# Patient Record
Sex: Female | Born: 1940 | Race: White | Hispanic: Yes | Marital: Married | Smoking: Never smoker
Health system: Western US, Academic
[De-identification: ages and names within clinical notes are randomized; demographics above are authoritative.]

## PROBLEM LIST (undated history)

## (undated) DIAGNOSIS — E119 Type 2 diabetes mellitus without complications: Secondary | ICD-10-CM

## (undated) DIAGNOSIS — R002 Palpitations: Secondary | ICD-10-CM

## (undated) DIAGNOSIS — I10 Essential (primary) hypertension: Secondary | ICD-10-CM

## (undated) HISTORY — PX: HYSTERECTOMY: SHX81

## (undated) HISTORY — PX: BLADDER SURGERY: SHX569

---

## 2014-08-27 ENCOUNTER — Emergency Department: Admission: EM | Admit: 2014-08-27 | Payer: Self-pay | Admitting: Emergency Medicine

## 2014-08-27 LAB — COMPREHENSIVE METABOLIC PANEL, BLOOD
ALT: 18 U/L (ref 7–52)
AST: 17 U/L (ref 13–39)
Albumin: 4.2 G/DL (ref 3.7–5.3)
Alk Phos: 135 U/L — ABNORMAL HIGH (ref 34–104)
BUN: 12 mg/dL (ref 7–25)
Bilirubin, Total: 0.8 mg/dL (ref 0.3–1.0)
CO2: 26 mmol/L (ref 21–31)
Calcium: 9 mg/dL (ref 8.6–10.3)
Chloride: 102 mmol/L (ref 98–107)
Creat: 0.5 mg/dL — ABNORMAL LOW (ref 0.6–1.2)
Electrolyte Balance: 8 mmol/L (ref 2–12)
Glucose: 210 mg/dL — ABNORMAL HIGH (ref 85–125)
Potassium: 4.3 mmol/L (ref 3.5–5.1)
Protein, Total: 7.1 G/DL (ref 6.0–8.3)
Sodium: 136 mmol/L (ref 136–145)

## 2014-08-27 LAB — CBC WITH DIFF, BLOOD
Basophil: 0.1 10*3/uL (ref 0–0.2)
Eosinophil: 0.2 10*3/uL (ref 0–0.5)
Hematocrit: 38 % (ref 34.0–44.0)
Hgb: 12.8 G/DL (ref 11.5–15.0)
Lymphocyte: 1.8 10*3/uL (ref 0.9–3.3)
MCH: 30.1 PG (ref 27.0–33.5)
MCHC: 33.8 G/DL (ref 32.0–35.5)
MCV: 89 FL (ref 81.5–97.0)
Monocyte: 0.4 10*3/uL (ref 0–0.8)
Neutrophils: 5.5 10*3/uL (ref 2.0–8.1)
PLT Count: 261 10*3/uL (ref 150–400)
RBC: 4.26 10*6/uL (ref 3.70–5.00)
RDW-CV: 12.8 % (ref 11.6–14.4)
White Bld Cell Count: 7.9 10*3/uL (ref 4.0–10.5)

## 2014-08-27 LAB — LIPASE, BLOOD: Lipase: 27 U/L (ref 11–82)

## 2014-08-27 LAB — CPK+CKMB+INDEX PANEL, BLOOD
CKMB: 2.2 ng/mL (ref 0.0–5.0)
Tot CK: 60 U/L (ref 30–223)

## 2014-08-27 LAB — TROPONIN I, BLOOD: Troponin I: <0.03 ng/mL (ref 0.00–0.03)

## 2014-08-28 LAB — URINALYSIS
Bilirubin, UA: NEGATIVE
Glucose, UA: 150 MG/DL — AB
Hemoglobin, UA: NEGATIVE
Ketones, UA: NEGATIVE MG/DL
Leukocyte Esterase, UA: NEGATIVE
Nitrite, UA: NEGATIVE
Protein, UA: 30 MG/DL — AB
RBC, UA: 2 #/HPF (ref 0–3)
Specific Grav, UA: 1.018 (ref 1.003–1.030)
Squamous Epithelial, UA: 0 /HPF (ref 0–10)
Urobilinogen, UA: <2 MG/DL (ref <2.0)
WBC, UA: <1 #/HPF (ref 0–5)
pH, UA: 5 (ref 5.0–8.0)

## 2014-08-28 LAB — TROPONIN I, BLOOD: Troponin I: <0.03 ng/mL (ref 0.00–0.03)

## 2014-08-28 LAB — GLUCOSE POINT OF CARE TESTING: Glucose POC: 248 MG/DL — ABNORMAL HIGH (ref 85–125)

## 2014-09-01 ENCOUNTER — Emergency Department: Admission: EM | Admit: 2014-09-01 | Payer: Self-pay

## 2016-07-11 ENCOUNTER — Emergency Department
Admission: EM | Admit: 2016-07-11 | Discharge: 2016-07-12 | Disposition: A | Discharge status: Home / Self Care | Payer: No Typology Code available for payment source | Attending: Emergency Medicine | Admitting: Emergency Medicine

## 2016-07-11 DIAGNOSIS — Z794 Long term (current) use of insulin: Secondary | ICD-10-CM | POA: Insufficient documentation

## 2016-07-11 DIAGNOSIS — I10 Essential (primary) hypertension: Secondary | ICD-10-CM | POA: Insufficient documentation

## 2016-07-11 DIAGNOSIS — N390 Urinary tract infection, site not specified: Secondary | ICD-10-CM | POA: Insufficient documentation

## 2016-07-11 DIAGNOSIS — Z90710 Acquired absence of both cervix and uterus: Secondary | ICD-10-CM | POA: Insufficient documentation

## 2016-07-11 DIAGNOSIS — T83511A Infection and inflammatory reaction due to indwelling urethral catheter, initial encounter: Principal | ICD-10-CM | POA: Insufficient documentation

## 2016-07-11 DIAGNOSIS — E119 Type 2 diabetes mellitus without complications: Secondary | ICD-10-CM | POA: Insufficient documentation

## 2016-07-11 DIAGNOSIS — R339 Retention of urine, unspecified: Secondary | ICD-10-CM | POA: Insufficient documentation

## 2016-07-11 DIAGNOSIS — Y848 Other medical procedures as the cause of abnormal reaction of the patient, or of later complication, without mention of misadventure at the time of the procedure: Secondary | ICD-10-CM | POA: Insufficient documentation

## 2016-07-11 DIAGNOSIS — N281 Cyst of kidney, acquired: Secondary | ICD-10-CM | POA: Insufficient documentation

## 2016-07-11 DIAGNOSIS — N1330 Unspecified hydronephrosis: Secondary | ICD-10-CM | POA: Insufficient documentation

## 2016-07-11 HISTORY — DX: Type 2 diabetes mellitus without complications (CMS-HCC): E11.9

## 2016-07-11 HISTORY — DX: Palpitations: R00.2

## 2016-07-11 HISTORY — DX: Essential (primary) hypertension: I10

## 2016-07-11 LAB — COMPREHENSIVE METABOLIC PANEL, BLOOD
ALT: 8 U/L (ref 7–52)
AST: 12 U/L — ABNORMAL LOW (ref 13–39)
Albumin: 4 G/DL (ref 3.7–5.3)
Alk Phos: 93 U/L (ref 34–104)
BUN: 22 mg/dL (ref 7–25)
Bilirubin, Total: 0.6 mg/dL (ref 0.0–1.4)
CO2: 19 mmol/L — ABNORMAL LOW (ref 21–31)
Calcium: 9.4 mg/dL (ref 8.6–10.3)
Chloride: 104 mmol/L (ref 98–107)
Creat: 1 mg/dL (ref 0.6–1.2)
Electrolyte Balance: 9 mmol/L (ref 2–12)
Glucose: 167 mg/dL — ABNORMAL HIGH (ref 85–125)
Potassium: 4.6 mmol/L (ref 3.5–5.1)
Protein, Total: 7.4 G/DL (ref 6.0–8.3)
Sodium: 132 mmol/L — ABNORMAL LOW (ref 136–145)
eGFR - high estimate: >60 (ref >59)
eGFR - low estimate: 54 — ABNORMAL LOW (ref >59)

## 2016-07-11 LAB — CBC WITH DIFF, BLOOD
ANC automated: 9 10*3/uL — ABNORMAL HIGH (ref 2.0–8.1)
Basophils %: 0.3 %
Basophils Absolute: 0 10*3/uL (ref 0.0–0.2)
Eosinophils %: 0.3 %
Eosinophils Absolute: 0 10*3/uL (ref 0.0–0.5)
Hematocrit: 27.3 % — ABNORMAL LOW (ref 34.0–44.0)
Hgb: 9.6 G/DL — ABNORMAL LOW (ref 11.5–15.0)
Lymphocytes %: 6.4 %
Lymphocytes Absolute: 0.7 10*3/uL — ABNORMAL LOW (ref 0.9–3.3)
MCH: 30.8 PG (ref 27.0–33.5)
MCHC: 35.3 G/DL (ref 32.0–35.5)
MCV: 87.4 FL (ref 81.5–97.0)
MPV: 7.7 FL (ref 7.2–11.7)
Monocytes %: 7.1 %
Monocytes Absolute: 0.7 10*3/uL (ref 0.0–0.8)
Neutrophils % (A): 85.9 %
PLT Count: 300 10*3/uL (ref 150–400)
RBC: 3.12 10*6/uL — ABNORMAL LOW (ref 3.70–5.00)
RDW-CV: 13.5 % (ref 11.6–14.4)
White Bld Cell Count: 10.5 10*3/uL (ref 4.0–10.5)

## 2016-07-11 LAB — LIPASE, BLOOD: Lipase: 24 U/L (ref 11–82)

## 2016-07-11 MED ORDER — SODIUM CHLORIDE 0.9 % IV BOLUS (~~LOC~~)
500.0000 mL | INJECTION | Freq: Once | INTRAVENOUS | Status: AC
Start: 2016-07-11 — End: 2016-07-12
  Administered 2016-07-11 (×2): 500 mL via INTRAVENOUS

## 2016-07-11 NOTE — ED Provider Notes (Signed)
[facility] Treatment Record    CHIEF COMPLAINT:    Weakness      HISTORY OF PRESENT ILLNESS:   Carrie Knight is a 75 year old female w/ hx of DM, HTN, and urinary incontinence who presents with generalized weakness. Per patient's son at bedside, patient was hospitalized 2 weeks ago for urinary retention. Had Foley catheter placed. Went home with foley but has since been removed. Patient has appointment on Thursday (less than two days) with urology at [facility] to assess if she needs foley. Currently not on antibiotics. Feeling generalized weakness for past month. Worse over last couple days. Becomes very tired with walking short distances. No SOB, palpations or chest pain. Not eating for 1 day. +Vomiting. No fever. No melena or hematochezia. Per patient, only has small amount of urine that comes out when attempting to pee. However then has incontinence. Cant hold it. Leaking urine. Denies any burning or dysuria. Denies abdominal pain.    Glc has been 220 at home on insulin.     Location: weakness  Radiation: none  Quality: tired   Severity: moderate  Duration: 1 month  Timing: constant, worsening    REVIEW OF SYSTEMS:  Constitutional: no fever  Head: no headache  Eyes: no vision change  ENT: no hearing change  Endo: no polyuria  CV: no chest pain  Resp: no shortness of breath  GI: yes vomiting  GU: no hematuria  Back: no back pain  Skin: no rash  Neuro: no focal weakness    All other systems reviewed and negative except as noted above    PAST MEDICAL HISTORY:  Past Medical History:   Diagnosis Date   . Diabetes mellitus (CMS-HCC)    . Heart palpitations    . Hypertension        SURGICAL HISTORY:  Hysterectomy    ALLERGIES:  No Known Allergies     CURRENT MEDICATIONS:   See list    FAMILY HISTORY:  Reviewed and noncontributory     SOCIAL HISTORY:  Denies Tobacco  Denies Etoh  Denies Drugs    VITAL SIGNS:  BP 125/62  Pulse 103  Temp 99.8 F (37.7 C)  Resp 16  Ht 4\' 9"   (1.448 m)  Wt 65.9 kg (145 lb 4.5 oz)  SpO2 98%  BMI 31.44 kg/m2    PHYSICAL EXAM:  Gen: Alert, mild distress  HEENT: NC, AT, non-icteric, no conjunctival injection, dry mucus membranes, unremarkable-appearing external nose & ears   Neck: supple, full ROM  Pulm: no respiratory distress, no audible stridor   CV: tachycardic rate, regular rhythm   Abd: soft, non-tender, no rebound or gaurding  Back: nontender  Ext: no peripheral edema, warm and well-perfused   Skin: no jaundice, no cyanosis   Neuro: Awake, alert, moving extremities spontaneously, 5/5 strength throughout, gait normal   Psych: Appropriate, normal affect     MEDICAL DECISION MAKING:  Patient is a 75 yo F w/ hx of DM, urinary retention recent hospitalization and catheter who presents with generalized weakness. Will consider UTI vs urinary obstruction vs less likely pyelonephritis given no CVA tenderness or fever. Will also consider electrolyte abnormality.    PLAN:  Imaging  Labs  Possible foley placement  Fluids    ED COURSE  Workup Review   Ericka Pontiff Documentation   Value Comment Time   Leuk Esterase: (!) LARGE (Reviewed) 11/29 0111   POC ultrasound demonstrates post void residual of 312 cc. Mild hydronephrosis  and large right renal cyst. Foley catheter placed given acute urinary retention, patient will follow up with urologist on Thursday to evaluate for possible removal.     CT ab/pelvis state read w/ mild right greater than left hydroureteronpehrosis. Right renal cyst. UA is positive, WBC >182, large leuk esterase and hematuria. Urine cultures sent. Treated patient empirically with 1 dose of ertapenem given recent indwelling foley. Will discharge home on cefdinir. Discussed results with patient. Strict return precautions provided.     Yisroel Ramming, MD      DIAGNOSIS:    ICD-10-CM ICD-9-CM   1. Urinary retention R33.9 788.20   2. Urinary tract infection associated with indwelling urethral catheter, initial encounter (CMS-HCC) T83.511A 996.64      N39.0 599.0   3.  Diabetes mellitus with mild hyperglycemia.   4.  Urinary retention, recurrent, suspect due to neurogenic bladder.  5.  Vomiting and anorexia.  6.  Normocytic anemia, cause undetermined.   7.  Large right renal cyst.  8.  Hydronephrosis.       Disposition:  Home       Yisroel Ramming, MD  Resident  07/12/16 [phone]    Attending Attestation: I was present with the resident during the history and exam.  I discussed the case with the resident and agree with the findings and plan as documented by the resident.  My additions or revisions are included in the record.    Pryor Ochoa, MD  Professor of Emergency Medicine  07/14/16, 11:52 AM       Baird Lyons, MD  07/14/16 1153

## 2016-07-11 NOTE — ED Procedure Note (Addendum)
Procedure Note  Ultrasound  Date/Time: 07/11/2016 11:50 PM  Performed by: Sabel Hornbeck  Authorized by: Malie Kashani             Renal Ultrasound and bladder ultrasound  Indication: Patient has difficulty urinating with concern for recurrence of acute urinary retention  Findings: Mild left and right hydronephrosis; post void residual urine volume 310 mL consistent with acute urinary retenrion; large right renal cyst.   Procedure: Coronal and transverse planes of the right kidney were obtained and showed mild hydronephrosis. Next, coronal and transverse planes of the left kidney were obtained and showed mild left hydronephrosis. There was a large anechoic structure in the right kidney consistent with a renal cyst. Next the bladder was scanned in transverse and sagittal planes and found to have a urine volume of 351mL. Video clips were recorded for archival purposes. The images were reviewed by the ED attending physician.

## 2016-07-11 NOTE — ED Triage Notes (Signed)
This patient is a 75 year old female with a history of DM presenting to the ED for evaluation of urinary incontinence and generalized weakness. She also reports swelling to feet, and difficulty standign  Has visited 2 hospitals x1 month with no relief. Was recently seen at [place] [facility] for acute urinary retention. Denies CP and SOB.     Plan: Will obtain UA, EKG and baseline labs. Will send to main ED for further evaluation.     Scribe Attestation  The notes I am recording reflect only actions made by and judgments taken by this provider, Dr. Ricki Miller, for whom I am scribing today.  I have performed no independent clinical work.    Micaela Sotelo    ____________________________________________________________________    Provider Attestation for Scribed Note    As the attending provider, I agree with the scribed content.  Any changes or edits are noted in the text above.    Jevin Camino

## 2016-07-12 ENCOUNTER — Emergency Department: Payer: No Typology Code available for payment source

## 2016-07-12 DIAGNOSIS — N132 Hydronephrosis with renal and ureteral calculous obstruction: Secondary | ICD-10-CM

## 2016-07-12 DIAGNOSIS — N134 Hydroureter: Secondary | ICD-10-CM

## 2016-07-12 LAB — URINALYSIS WITH CULTURE REFLEX, WHEN INDICATED
Bilirubin, UA: NEGATIVE
Glucose, UA: >500 MG/DL — AB
Ketones, UA: NEGATIVE MG/DL
Nitrite, UA: NEGATIVE
Protein, UA: 30 MG/DL — AB
RBC, UA: 13 #/HPF — ABNORMAL HIGH (ref 0–3)
Specific Grav, UA: 1.006 (ref 1.003–1.030)
Squamous Epithelial, UA: 1 /HPF (ref 0–10)
Urobilinogen, UA: <2 MG/DL (ref <2.0)
WBC, UA: >182 #/HPF — ABNORMAL HIGH (ref 0–5)
pH, UA: 5 (ref 5.0–8.0)

## 2016-07-12 MED ORDER — ONDANSETRON HCL 4 MG/2ML IV SOLN
4.00 mg | Freq: Once | INTRAMUSCULAR | Status: AC
Start: 2016-07-12 — End: 2016-07-12
  Administered 2016-07-12: 4 mg via INTRAVENOUS

## 2016-07-12 MED ORDER — ERTAPENEM SODIUM 1 G IV SOLR
1000.00 mg | Freq: Once | INTRAVENOUS | Status: AC
Start: 2016-07-12 — End: 2016-07-12
  Administered 2016-07-12 (×2): 1000 mg via INTRAVENOUS
  Filled 2016-07-12: qty 1000

## 2016-07-12 MED ORDER — CEFDINIR 300 MG OR CAPS
300.0000 mg | ORAL_CAPSULE | Freq: Two times a day (BID) | ORAL | 0 refills | Status: AC
Start: 2016-07-12 — End: 2016-07-17

## 2016-07-12 MED ORDER — ONDANSETRON HCL 4 MG/2ML IV SOLN
INTRAMUSCULAR | Status: AC
Start: 2016-07-12 — End: 2016-07-12
  Administered 2016-07-12 (×2): 4 mg via INTRAVENOUS
  Filled 2016-07-12: qty 2

## 2016-07-12 NOTE — Discharge Instructions (Signed)
You have been evaluated in the Emergency Department today. You had CT exam, lab tests and ultrasound done which showed acute urinary retention and a urinary tract infection. Your bladder scan showed a post residual void of 312 cc. You had a foley placed and you had one dose of IV ertapenem to treat your infection. You will need to take antibiotics as prescribed. You should also follow up with your urology appointment on Thursday. Your evaluation has not indicated the presence of a medical emergency at this time.    Your CT results: mild right greater than left hydrouretoernephrosis. Distal right ureter is adjacent to found densities, perhaps suture material. Right renal cyst.     The follow-up appointment is a very important part of your ongoing medical evaluation and management and should not be skipped.    If you are feeling worse or develop any new or concerning symptoms such as fever, chills, nausea, vomiting or inability to eat or drink, return to the emergency department immediately.       Infeccin del tracto urinario     Urinary Tract Infection     1.  Se le ha diagnosticado una infeccin del tracto urinario inferior. Tambin se conoce como cistitis.    1.  You have been diagnosed with a lower urinary tract infection (UTI). This is also called cystitis.             2.  La cistitis es una infeccin en la vejiga. Su mdico la diagnostic al hacer exmenes de su orina. Normalmente, la cistitis causa ardor al Su Grand o una necesidad frecuente de Garment/textile technologist. Puede sentir ganas de Garment/textile technologist sin necesitar de hacerlo.    2.  Cystitis is an infection in your bladder. Your doctor diagnosed it by testing your urine. Cystitis usually causes burning with urination or frequent urination. It might make you feel like you have to urinate even when you don't.              3.  La cistitis generalmente se trata con antibiticos y analgsicos.   3.  Cystitis is usually treated with antibiotics and  medicine to help with pain.             4.  Es MUY IMPORTANTE que surta su receta y tome todos los antibiticos como se indique. Si una infeccin del tracto urinario no se trata [employer], puede convertirse en una infeccin de rin.   4.  It is VERY IMPORTANT that you fill your prescription and take all of the antibiotics as directed. If a lower urinary tract infection goes untreated for too long, it can become a kidney infection.             5.  PARA LAS MUJERES: para reducir el riesgo de volver a contraer cistitis:    5.  FOR WOMEN: To reduce the risk of getting cystitis again:      * Siempre orine antes y despus de un encuentro sexual.    * Always urinate before and after sexual intercourse.      * Siempre lmpiese de delante hacia atrs despus de Production assistant, radio. No se limpie de atrs hacia delante.     * Always wipe from front to back after urinating or having a bowel movement. Do not wipe from back to front.      * Beba bastantes lquidos. Trate de beber jugo de arndanos rojos o azules. Estos jugos tienen una sustancia qumica que evita que la bacteria  se "pegue" a la vejiga.    * Drink plenty of fluids. Try to drink cranberry or blueberry juice. These juices have a chemical that stops bacteria from "sticking" to the bladder.             6.  DEBE BUSCAR ATENCIN MDICA INMEDIATA, AQU O EN LA SALA DE EMERGENCIAS MS CERCANA, SI SE PRESENTA CUALQUIERA DE LAS SIGUIENTES SITUACIONES:   6.  YOU SHOULD SEEK MEDICAL ATTENTION IMMEDIATELY, EITHER HERE OR AT THE NEAREST EMERGENCY DEPARTMENT, IF ANY OF THE FOLLOWING OCCURS:      * Tiene fiebre (temperatura mayor de 100.75F / 38C) o escalofros.    * You have a fever (temperature higher than 100.75F / 38C) or shaking chills.      * Siente nuseas o vomita.    * You feel nauseated or vomit.      * Tiene dolor en un costado o en la espalda.    * You have pain in your side or back.      * No mejora  despus de tomar todos sus antibiticos.    * You don't get better after taking all of your antibiotics.      * Tiene nuevos sntomas o molestias.     * You have any new symptoms or concerns.      * Se siente peor o no mejora.     * You feel worse or do not improve.                          Cuidado del catter de Foley     Foley Catheter Care     1.  Se le ha colocado un catter de Foley para drenar su orina (pip).   1.  You have had a Foley catheter placed to drain your urine (pee).             2.  Esto se requiere a menudo cuando hay una obstruccin en la base de la vejiga, en la prstata o en la uretra (el tubo que transporta la orina desde la vejiga hacia el exterior). Por lo general, el tubo solo se necesita por [employer].   2.  This is most often needed when there is a blockage at the base of the bladder, in the prostate or in the urethra (the tube that carries urine from the bladder to the outside). Usually the tube is only needed for a few days.             3.  El catter se conecta a una bolsa. Necesitar cambiar la bolsa cuando se llene. [employer] tipos de bolsas: una bolsa para la pierna, la cual es fcil de usar bajo la ropa, y una ms grande, la cual puede ser ms fcil de usar por las noches. El personal de Patent examiner mostrar cmo cambiar adecuadamente la bolsa.   3.  The catheter is attached to a bag. You will need to change the bag when it gets full. There are two types of bags: a leg bag, which is easier to wear under clothes, and a larger bag, which may be easier to use at night. The nurse will show you how to properly change the bag.             4.  El catter es colocado en la vejiga bajo condiciones estriles. Sin embargo, an se Engineering geologist infeccin. El catter tambin puede obstruirse (bloquearse).  4.  The catheter is put into the bladder under sterile conditions. However, an infection can still develop. The catheter can  also get obstructed (blocked).             5.  La infeccin es el problema ms comn con los catteres de Foley. Algunos signos y sntomas de infeccin son:   5.  Infection is the most common problem with Foley catheters. Some signs & symptoms of infection are:      * Fiebre (temperatura mayor de 100.60F/38C), malestar (sensacin de sentirse mal) y escalofros. Las nuseas (sentirse mal del Paramedic) y los vmitos (devolver el estmago) tambin son sntomas. Puede haber ardor en la uretra, pene/vagina o rea pbica.    * Fever (temperature higher than 100.60F / 38C), malaise (a feeling of unwellness) and chills. Nausea (feeling sick to the stomach) and vomiting (throwing up) are also symptoms. There may be burning of the urethra, penis/vagina or pubic area.      * Fuga de orina alrededor del catter.    * Urine leaking from around the catheter.      * Incremento de espasmos en las piernas, abdomen (estmago) o vejiga. Los espasmos leves y poco frecuentes son normales.    * Increased spasms of the legs, abdomen (belly) or bladder. Mild, infrequent spasms are normal.      * Sedimentos (depsitos turbios) o mucosidad en la orina. Puede haber Bennie Hind turbia, con sangre (rosada o roja) u orina que huela muy mal.    * Sediment (cloudy deposits) or mucus in the urine. There may be cloudy urine, bloody (pink or red) urine or urine that smells very bad.             6.  Use las instrucciones siguientes para el cuidado diario del catter de Foley. Al hacerlo, ayudar a reducir el riesgo de infeccin:   6.  Use the instructions below for daily Foley catheter care. Doing so will help reduce the risk of infection:      * Lvese las manos con agua y jabn antes y despus de Optometrist cualquier cuidado relacionado al catter.    * Wash your hands with soap and water before and after doing any catheter care.      * Limpie la piel alrededor del sitio de insercin del catter con agua y [place].  Use una toallita caliente. Sostenga con cuidado el catter mientras se lava. Retire cualquier costra o desechos de la piel y Print production planner.    * Clean the skin around the catheter insertion site with soap and water. Use a warm washcloth. Gently hold onto the catheter while washing. Remove any crust or debris (waste) on the skin and catheter.      * Siempre mantenga la bolsa del catter por debajo del nivel de la vejiga. Esto evita que la orina que est en la bolsa regrese a su vejiga. Esto ayudar a Education officer, environmental infeccin.    * Always keep the catheter bag below the level of the bladder. This keeps urine in the bag from going back into your bladder. This will help to prevent an infection      * Beba bastantes lquidos. Beba varios vasos con agua al da, a menos que su mdico haya limitado su consumo de lquidos. Si tiene sed, no ha bebido lo Holiday representative de hoy!    * Drink plenty of fluids. Drink a few glasses of water a day unless your doctor has limited the amount  of fluids you can have. If you are thirsty, you haven't had enough to drink today!      * Vace la bolsa de orina cuando se encuentre a 2/3 de su capacidad total. Lvese las manos con agua y jabn antes de vaciar la bolsa. Drene la bolsa en el excusado o en un contenedor que est en el piso. Quite la tapa de la punta y abra la vlvula de deslizamiento. Deje que la [employer]. No toque el extremo de la boca de drenaje.    * Empty the urine bag when it is about 2/3 full. Wash your hands with soap and water before you empty the bag. Drain the bag right into the toilet or a container on the floor. Take off the tip cover and open the slide valve. Let the urine flow out. Do not touch the end of the drain spout.             7.  Su mdico ha determinado que se recetarn antibiticos para ayudar a Education officer, environmental infeccin.   7.  Your doctor has decided that antibiotics will be prescribed to help prevent an infection             8.   DEBERA BUSCAR ATENCIN MDICA INMEDIATAMENTE, AQU O EN LA SALA DE EMERGENCIAS MS CERCANA, SI SE PRESENTA CUALQUIERA DE LAS SIGUIENTES SITUACIONES:   8.  YOU SHOULD SEEK MEDICAL ATTENTION IMMEDIATELY, EITHER HERE OR AT THE NEAREST EMERGENCY DEPARTMENT, IF ANY OF THE FOLLOWING OCCURS:      * Si tiene fiebre (temperatura mayor de 100.39F/38C), dolor en un costado (un lado), nuseas (sentirse mal del Paramedic), vmitos (devolver el estmago), escalofros.    * Fever (temperature higher than 100.39F / 38C), flank (side) pain, nausea (sick to your stomach) or vomiting (throwing up), shaking chills.      * Si tiene constantes sensaciones de obstruccin en la vejiga, sin que salga orina, dolor en la parte baja del abdomen (estmago) o fuga alrededor del tubo. Esto puede significar que el catter se ha bloqueado o que no est drenando apropiadamente.    * Repeated feelings of bladder blockage with no urine coming out, low abdominal (belly) pain or leaking around the tube. This may mean the catheter is blocked or is not draining properly.      * Si hay otro sntoma que empeore o si tiene alguna inquietud.    * Any other worsening symptoms or any other concerns.      * Si la Bennie Hind est turbia y huele mal.    * The urine is cloudy and smells bad.      * Si la orina gotea alrededor del catter o si el catter se cae.    * Urine leaks around the catheter or the catheter falls out.

## 2016-07-13 LAB — URINE CULTURE

## 2017-12-03 ENCOUNTER — Emergency Department
Admission: EM | Admit: 2017-12-03 | Discharge: 2017-12-03 | Disposition: A | Discharge status: Home / Self Care | Payer: No Typology Code available for payment source | Attending: Emergency Medicine | Admitting: Emergency Medicine

## 2017-12-03 DIAGNOSIS — I10 Essential (primary) hypertension: Secondary | ICD-10-CM | POA: Insufficient documentation

## 2017-12-03 DIAGNOSIS — E119 Type 2 diabetes mellitus without complications: Secondary | ICD-10-CM | POA: Insufficient documentation

## 2017-12-03 DIAGNOSIS — Z436 Encounter for attention to other artificial openings of urinary tract: Principal | ICD-10-CM | POA: Insufficient documentation

## 2017-12-03 LAB — GLUCOSE, POINT OF CARE: Glucose, Point of Care: 266 mg/dL — ABNORMAL HIGH (ref 70–125)

## 2017-12-03 NOTE — ED Notes (Signed)
Pt/family re-educated on correct use and care of foley cath and drainage bag. Pt informed that frequent changing of complete foley more likely to cause infection. Pt/family verbalized understanding

## 2017-12-03 NOTE — ED Provider Notes (Signed)
CHIEF COMPLAINT:   Urinary Catheter Problem (foley is uncomfortable and " might be infected" x 3 days)      HISTORY OF PRESENT ILLNESS:   Carrie Knight is a 77 year old female with past medical history of uterine Zelienople with resection and radiation, hypertension and diabetes mellitus that presented to the ED with a request for changing of leg bag. States that she has not had her catheter and leg bag changed in 3 weeks. States that in the past when she waits "this long to change her bag" she has been diagnosed with urinary tract infections. Denies any fever or chills, nausea or vomiting, back pain, dizziness, headache.     REVIEW OF SYSTEMS:  Constitutional: no fever  Head: no headache  CV: no chest pain  Resp: no shortness of breath  GI: no vomiting  Back: no back pain  All other systems reviewed and negative except as noted above    PAST MEDICAL HISTORY:  Past Medical History:   Diagnosis Date   . Diabetes mellitus (CMS-HCC)    . Heart palpitations    . Hypertension         SURGICAL HISTORY:  No past surgical history on file.     ALLERGIES:  No Known Allergies     CURRENT MEDICATIONS:   Please see nursing notes    FAMILY HISTORY:  Reviewed and noncontributory:      SOCIAL HISTORY:  ETOH use: no  Tobacco use: no  Illicit drug use: no    VITAL SIGNS:  BP 171/74 (BP Location: Right arm, BP Patient Position: Sitting)   Pulse 72   Temp 98.4 F (36.9 C)   Resp 16   Ht 4\' 9"  (1.448 m)   Wt 68 kg (150 lb)   SpO2 99%   BMI 32.46 kg/m     PHYSICAL EXAM:  GEN: Awake, Alert, No apparent distress  HEENT: NC/AT. Pupils equally round and reactive to light, moist mucous membranes  NECK: Supple, no nuchal rigidity  CVS: Regular rhythm without murmurs, rubs or gallops  CHEST: Breath sounds clear to auscultation bilaterally, no wheezes, rhonchi or rales, no respiratory distress  ABD: + bowel sounds, soft, non-tender, non-distended  BACK: no costovertebral angle tenderness   EXT: warm, well perfused, no cyanosis, clubbing or  edema   NEURO: Alert and oriented x 4, no focal neuro deficit noted, moves all extremities, sensation intact to all extremities.  SKIN: No rash, No jaundice  Psych: appropriate    Labs Reviewed - No data to display  No orders to display     No results found.    MDM/Plan:  Carrie Knight is a 77 year old female who presents with request for change of leg bag. Educated patient and family about the foley catheter and the plan for patient to follow up with urology for better understanding of her catheter. She was instructed by an OSH emergency department the importance of changing the entire F/C every 3 weeks and was insistent until better educated. Discussed with patient and her daughter the importance of following ONLY the instructions of her urologist and both agrees. Patient does not have any symptoms of urinary tract infection.    Discussed with patient the importance of early return if no improvement or if symptoms become worse. Patient agrees with discharge instructions and close follow up with primary medical doctor.       ED COURSE:  Workup Summary       Value   Comment By  Time       After discussion, patient requesting foley cath change. Discussed with patient and her daughter that the entore catheter change is not ideal and introduces micribes and places patient at higher incidence of infection. Patient and her daughter agree. Sent with new leg bag andthey agree to follow up with urology for recommendations. Plan for discharge home Frances Maywood, [place] 04/22 1722             CLINICAL IMPRESSION:    ICD-10-CM ICD-9-CM   1. Indwelling urinary catheter present Z96.0 V45.89   2. At risk for UTI related to indwelling catheter Z91.89 V15.89     ?      Frances Maywood, NP  12/03/17  5:12 PM     Frances Maywood, NP  12/06/17 1008

## 2017-12-03 NOTE — Discharge Instructions (Signed)
Follow up with your primary physician within 1-3 days for a recheck of your symptoms and notify her/him of your emergency department visit.     Return to the emergency department if you have any worsening of symptoms, do not improve or have any concerns.

## 2018-12-05 ENCOUNTER — Emergency Department: Payer: No Typology Code available for payment source

## 2018-12-05 ENCOUNTER — Emergency Department
Admission: EM | Admit: 2018-12-05 | Discharge: 2018-12-05 | Disposition: A | Discharge status: Home / Self Care | Payer: No Typology Code available for payment source | Attending: Emergency Medical Services | Admitting: Emergency Medical Services

## 2018-12-05 DIAGNOSIS — R05 Cough: Secondary | ICD-10-CM

## 2018-12-05 DIAGNOSIS — R002 Palpitations: Secondary | ICD-10-CM | POA: Insufficient documentation

## 2018-12-05 DIAGNOSIS — E119 Type 2 diabetes mellitus without complications: Secondary | ICD-10-CM | POA: Insufficient documentation

## 2018-12-05 DIAGNOSIS — R079 Chest pain, unspecified: Secondary | ICD-10-CM

## 2018-12-05 DIAGNOSIS — N3000 Acute cystitis without hematuria: Secondary | ICD-10-CM | POA: Insufficient documentation

## 2018-12-05 DIAGNOSIS — I10 Essential (primary) hypertension: Secondary | ICD-10-CM

## 2018-12-05 DIAGNOSIS — J9809 Other diseases of bronchus, not elsewhere classified: Secondary | ICD-10-CM

## 2018-12-05 DIAGNOSIS — I700 Atherosclerosis of aorta: Secondary | ICD-10-CM

## 2018-12-05 DIAGNOSIS — R509 Fever, unspecified: Secondary | ICD-10-CM

## 2018-12-05 DIAGNOSIS — Z794 Long term (current) use of insulin: Secondary | ICD-10-CM | POA: Insufficient documentation

## 2018-12-05 LAB — URINALYSIS WITH CULTURE REFLEX, WHEN INDICATED
Bilirubin, UA: NEGATIVE
Glucose, UA: 150 MG/DL — AB
Ketones, UA: NEGATIVE MG/DL
Nitrite, UA: POSITIVE — AB
Protein, UA: 100 MG/DL — AB
RBC, UA: 9 #/HPF — ABNORMAL HIGH (ref 0–3)
Specific Grav, UA: 1.017 (ref 1.003–1.030)
Squamous Epithelial, UA: <1 /HPF (ref 0–10)
Urobilinogen, UA: <2 MG/DL (ref <2.0)
WBC, UA: 44 #/HPF — ABNORMAL HIGH (ref 0–5)
pH, UA: 5 (ref 5.0–8.0)

## 2018-12-05 LAB — CBC WITH DIFF, BLOOD
ANC automated: 2.2 10*3/uL (ref 2.0–8.1)
Basophils %: 0.4 %
Basophils Absolute: 0 10*3/uL (ref 0.0–0.2)
Eosinophils %: 1.2 %
Eosinophils Absolute: 0 10*3/uL (ref 0.0–0.5)
Hematocrit: 32.3 % — ABNORMAL LOW (ref 34.0–44.0)
Hgb: 10.9 G/DL — ABNORMAL LOW (ref 11.5–15.0)
Lymphocytes %: 22.2 %
Lymphocytes Absolute: 0.7 10*3/uL — ABNORMAL LOW (ref 0.9–3.3)
MCH: 29.4 PG (ref 27.0–33.5)
MCHC: 33.9 G/DL (ref 32.0–35.5)
MCV: 87 FL (ref 81.5–97.0)
MPV: 7.5 FL (ref 7.2–11.7)
Monocytes %: 9.3 %
Monocytes Absolute: 0.3 10*3/uL (ref 0.0–0.8)
Neutrophils % (A): 66.9 %
PLT Count: 189 10*3/uL (ref 150–400)
RBC: 3.71 10*6/uL (ref 3.70–5.00)
RDW-CV: 14.7 % — ABNORMAL HIGH (ref 11.6–14.4)
White Bld Cell Count: 3.3 10*3/uL — ABNORMAL LOW (ref 4.0–10.5)

## 2018-12-05 LAB — BNP, BLOOD: BNP: 117 pg/mL — ABNORMAL HIGH (ref 0–100)

## 2018-12-05 LAB — COMPREHENSIVE METABOLIC PANEL, BLOOD
ALT: 17 U/L (ref 7–52)
AST: 20 U/L (ref 13–39)
Albumin: 3.7 G/DL (ref 3.7–5.3)
Alk Phos: 90 U/L (ref 34–104)
BUN: 19 mg/dL (ref 7–25)
Bilirubin, Total: 0.3 mg/dL (ref 0.0–1.4)
CO2: 22 mmol/L (ref 21–31)
Calcium: 8.8 mg/dL (ref 8.6–10.3)
Chloride: 106 mmol/L (ref 98–107)
Creat: 0.7 mg/dL (ref 0.6–1.2)
Electrolyte Balance: 8 mmol/L (ref 2–12)
Glucose: 244 mg/dL — ABNORMAL HIGH (ref 85–125)
Potassium: 3.8 mmol/L (ref 3.5–5.1)
Protein, Total: 6.5 G/DL (ref 6.0–8.3)
Sodium: 136 mmol/L (ref 136–145)
eGFR - high estimate: >60 (ref >59)
eGFR - low estimate: >60 (ref >59)

## 2018-12-05 LAB — CORONAVIRUS DISEASE 2019 (COVID-19) SCOVS
COVID-19 Comment: NOT DETECTED
COVID-19 Result: DETECTED — AB

## 2018-12-05 LAB — PT/INR/PTT
INR: 1.02 (ref 0.88–1.12)
PTT: 30.7 s (ref 24.3–35.0)
Prothrombin Time: 13.4 s (ref 12.0–14.4)

## 2018-12-05 LAB — TROPONIN I, HIGH SENSITIVITY
Troponin I, High Sensitivity: 13 ng/L (ref 0–15)
Troponin I, High Sensitivity: 8 ng/L (ref 0–15)

## 2018-12-05 LAB — MAGNESIUM, BLOOD: Magnesium: 1.6 mg/dL — ABNORMAL LOW (ref 1.9–2.7)

## 2018-12-05 MED ORDER — LISINOPRIL 40 MG OR TABS
40.00 mg | ORAL_TABLET | Freq: Every day | ORAL | Status: DC
Start: ? — End: 2020-05-03

## 2018-12-05 MED ORDER — AMOXICILLIN 500 MG OR CAPS
500.00 mg | ORAL_CAPSULE | Freq: Three times a day (TID) | ORAL | Status: DC
Start: ? — End: 2020-05-03

## 2018-12-05 MED ORDER — CEFTRIAXONE SODIUM 2 GM IJ SOLR
2000.00 mg | Freq: Once | INTRAMUSCULAR | Status: AC
Start: 2018-12-05 — End: 2018-12-05
  Administered 2018-12-05 (×2): 2000 mg via INTRAVENOUS
  Filled 2018-12-05: qty 2000

## 2018-12-05 MED ORDER — METFORMIN HCL 1000 MG OR TABS
1.00 | ORAL_TABLET | Freq: Two times a day (BID) | ORAL | Status: DC
Start: ? — End: 2020-05-03

## 2018-12-05 MED ORDER — MAGNESIUM SULFATE 2 GM/50ML IV SOLN
2.00 g | Freq: Once | INTRAVENOUS | Status: AC
Start: 2018-12-05 — End: 2018-12-05
  Administered 2018-12-05 (×2): 2 g via INTRAVENOUS
  Filled 2018-12-05: qty 50

## 2018-12-05 MED ORDER — CARVEDILOL 25 MG OR TABS
25.00 mg | ORAL_TABLET | Freq: Two times a day (BID) | ORAL | Status: DC
Start: ? — End: 2020-05-03

## 2018-12-05 MED ORDER — CEFDINIR 300 MG OR CAPS
300.00 mg | ORAL_CAPSULE | Freq: Two times a day (BID) | ORAL | 0 refills | Status: AC
Start: 2018-12-05 — End: 2018-12-11

## 2018-12-05 MED ORDER — ALBUTEROL SULFATE 108 (90 BASE) MCG/ACT IN AERS
2.00 | INHALATION_SPRAY | Freq: Once | RESPIRATORY_TRACT | Status: AC
Start: 2018-12-05 — End: 2018-12-05
  Administered 2018-12-05 (×2): 2 via RESPIRATORY_TRACT
  Filled 2018-12-05 (×2): qty 6.7

## 2018-12-05 MED ORDER — IBUPROFEN 800 MG OR TABS
800.00 mg | ORAL_TABLET | Freq: Four times a day (QID) | ORAL | Status: DC | PRN
Start: ? — End: 2020-05-03

## 2018-12-05 MED ORDER — ASPIRIN 81 MG OR TABS
81.00 mg | ORAL_TABLET | Freq: Every day | ORAL | Status: DC
Start: ? — End: 2020-05-04

## 2018-12-05 MED ORDER — INSULIN GLARGINE 100 UNIT/ML SC SOLN
34.00 [IU] | Freq: Every day | SUBCUTANEOUS | Status: DC
Start: ? — End: 2020-05-03

## 2018-12-05 MED ORDER — ACETAMINOPHEN 325 MG OR TABS
650.00 mg | ORAL_TABLET | Freq: Once | ORAL | Status: AC
Start: 2018-12-05 — End: 2018-12-05
  Administered 2018-12-05 (×2): 650 mg via ORAL
  Filled 2018-12-05: qty 2

## 2018-12-05 NOTE — ED Notes (Signed)
Daughter Khailee Mick [phone]) to pick pt up. ETA 10 min.

## 2018-12-05 NOTE — ED Notes (Signed)
Pt ambulated without any difficulty , O2 remained 98% throughout. Pt denied SOB, HA, lightheadedness, or any oher symptoms.

## 2018-12-05 NOTE — ED EKG Interpretation (Signed)
ED EKG Interpretation    EKG: Normal Sinus Rhythm at 88bpm with Normal Axis and no ischemic changes.    Russella Dar, MD  12/05/18  10:03 PM

## 2018-12-05 NOTE — ED Provider Notes (Signed)
CHIEF COMPLAINT  Chest Pain - Adult (w/ cough onset this am. )      HISTORY OF PRESENT ILLNESS:   Carrie Knight is a 78 year old female with past medical history of DM, HTN, who presents with cough.  Patient reports onset of symptoms this morning with midsternal nonradiating chest pain.  Endorses associated cough that began this morning.  Denies shortness of breath.  Positive fevers.  Denies abdominal pain.  Denies sick contacts.  Denies corona virus contacts.  Denies nausea, vomiting, diaphoresis.  Nothing makes symptoms better or worse.    Location:  Chest  Radiation:  None  Quality:  Sharp    Severity: moderate  Duration: 1 day  Timing: constant  Context: cough    REVIEW OF SYSTEMS:  Constitutional: + fever  Head: - headache  CV: + chest pain  Resp: - shortness of breath  GI: - vomiting  Back: - back pain  Skin: - rash  Neuro: - focal weakness    All other systems reviewed and negative except as noted above    PAST MEDICAL HISTORY:  Past Medical History:   Diagnosis Date   . Diabetes mellitus (CMS-HCC)    . Heart palpitations    . Hypertension         SURGICAL HISTORY:  Denies    ALLERGIES:  No Known Allergies      CURRENT MEDICATIONS:   Please see nursing notes    FAMILY HISTORY:  Reviewed and noncontributory      SOCIAL HISTORY:  - Tobacco  - Alcohol  - Drug use    VITAL SIGNS:  BP 143/68   Pulse 103   Temp 100.9 F (38.3 C)   Resp 18   Ht 4\' 9"  (1.448 m)   Wt 68 kg (149 lb 14.6 oz)   SpO2 100%   BMI 32.44 kg/m     PHYSICAL EXAM:  General: Awake, alert, appears to be in no distress   Head: Normocephalic, atraumatic   Eyes: No scleral icterus, no conjunctival injection, extraocular motion intact   ENT: Normal appearing ears externally, normal appearing nose externally   Neck: Supple, full range of motion of neck   Respiratory: Normal effort, no audible stridor  Cardiovascular: tachycardic rate, no murmurs, well perfused   Abdomen: Soft, non-distended, non-tender.   Back: No costovertebral angle  tenderness   Skin: No jaundice, no rash   Extremities: No upper extremity asymmetry, no lower extremity asymmetry   Neuro: Awake, alert, moving all extremities   Psych: Conversing appropriately, appropriate mood and affect     LABS:  Labs Reviewed   CBC WITH DIFF, BLOOD - Abnormal; Notable for the following components:       Result Value    White Bld Cell Count 3.3 (*)     Hgb 10.9 (*)     Hematocrit 32.3 (*)     RDW-CV 14.7 (*)     Lymphocytes Absolute 0.7 (*)     All other components within normal limits   MAGNESIUM, BLOOD - Abnormal; Notable for the following components:    Magnesium 1.6 (*)     All other components within normal limits   BNP, BLOOD - Abnormal; Notable for the following components:    BNP 117 (*)     All other components within normal limits   URINALYSIS WITH CULTURE REFLEX, WHEN INDICATED - Abnormal; Notable for the following components:    Protein, UA 100 (*)     Glucose, UA 150 (*)  Hemoglobin, UA MODERATE (*)     Leukocyte Esterase, UA LARGE (*)     Nitrite, UA POSITIVE (*)     RBC, UA 9 (*)     WBC, UA 44 (*)     Bacteria, UA FEW (*)     Mucous, UA FEW (*)     All other components within normal limits   COMPREHENSIVE METABOLIC PANEL, BLOOD - Abnormal; Notable for the following components:    Glucose 244 (*)     All other components within normal limits   CORONAVIRUS DISEASE 2019 (COVID-19) STAT SCOVS - Abnormal; Notable for the following components:    COVID-19 Result DETECTED (*)     All other components within normal limits   URINE CULTURE - Abnormal; Notable for the following components:    Culture Result > 100,000 COLONIES/ML GRAM NEGATIVE RODS (*)     All other components within normal limits   PT/INR/PTT   TROPONIN I, HIGH SENSITIVITY   TROPONIN I, HIGH SENSITIVITY       IMAGING:  X-Ray Chest Single View   Final Result   Findings/No significant effusion or pneumothorax is identified. No consolidation or airspace edema. Mild peribronchial thickening. Large cardiac silhouette with a  tortuous and calcified aorta. No acute displaced fractures.            MEDICAL DECISION MAKING:  Carrie Knight is a 78 year old female who presents with chest pain.   Differential diagnosis includes acute coronary syndrome as patient with chest pain and heart score (history, EKG, age, RF, trop) 4. Also consider pneumonia bacterial versus viral versus Covid 19 given fever tachycardia and cough although saturating well on room air with clear lung sounds bilaterally.    Will obtain labs, EKG, chest x-ray, urinalysis, IV fluids, Tylenol.    Workup Review as of Dec 07 1403   Others' Documentation   Thu Dec 05, 2018   2027 Discussed using cyracom [id number] that patient has the coronavirus infection as well as a UTI that we are treating with antibiotics.  Discussed in great detail importance of self quarantine as well as very strict return precautions for any increasing shortness of breath or dyspnea on exertion and patient expressed understanding and amenable to discharge.    [NB]   1958 Repeat troponin negative    [NB]   1958 Troponin I, High Sensitivity: 8 [NB]   1709 No desat maintained 98 during ambulation    [NB]   1707 Nurse to ambulate patient to eval for VS abnormalities    [NB]   1654 COVID-19 Result(!): DETECTED [NB]   1600 Lymphocytes Absolute(!): 0.7 [NB]   1549 IMPRESSION:  Findings/No significant effusion or pneumothorax is identified. No consolidation or airspace edema. Mild peribronchial thickening. Large cardiac silhouette with a tortuous and calcified aorta. No acute displaced fractures.    [NB]      Workup Review User Index  [NB] Roque Lias, MD       DIAGNOSIS:    ICD-10-CM ICD-9-CM   1. COVID-19 U07.1    2. Acute cystitis without hematuria N30.00 595.0   3. Type 2 diabetes mellitus without complication, unspecified whether long term insulin use (CMS-HCC) E11.9 250.00   4. Hypertension, unspecified type I10 401.9          Roque Lias, MD  Resident  12/06/18 2008    Attending Attestation: I  discussed the case with the resident and personally examined the patient. I agree with the findings and plan  as documented by the resident/fellow.  My additions or revision are included in the record.    78yo F with indwelling foley and chest pain. Found to have covid, no hypoxia with exertion. Will discharge with strict return precautions. Given antibiotics for UTI     Russella Dar, MD  12/07/18  2:05 PM          Russella Dar, MD  12/07/18 1406

## 2018-12-05 NOTE — Discharge Instructions (Signed)
Thank you for allowing [place] to care for you today at [facility]. Today, you were seen for chest pain. Based on your workup here in the emergency department, you were deemed stable for discharge.     You have the Coronavirus infection based on our testing today.  Please take the provided antibiotics for 6 days to treat your urine infection.    Please follow up with your regular doctor to ensure there are no other medical issues after your emergency department visit today.    Please return to the ER if you develop fevers, worsening of your symptoms, or have any other concerns.      Novel Coronavirus Disease 2019 (COVID-19)      What is the 2019 Novel Coronavirus?   In December 2019,  a new virus was found to have caused an outbreak of a cold-like illness in [place]. The outbreak has been linked to an animal and seafood market and has now spread to the majority of countries in the world including the [place].    What are the symptoms of 2019 Novel Coronavirus?  Symptoms can range from a very mild cold/flu-like illness to severe lung infection (e.g., pneumonia). Severe symptoms are more likely in those with underlying medical conditions, such as chronic heart or lung disease, or conditions that cause weakened immune systems (such as patients with cancer requiring chemotherapy).    What can I do to protect myself?  Currently, there is no vaccine for 2019 Novel Coronavirus. Follow these basic steps that can go a long way in protecting you and others from virus infections:   Clean your hands frequently (waterless alcohol hand rub or hand washing).    Avoid touching your eyes, nose, and mouth with unwashed hands.   Avoid close contact with people who are sick.   Stay home when you are sick.   Cover your cough or sneeze with a tissue, then throw the tissue in the trash.   Clean and disinfect frequently touched objects and surfaces.    Where can I get more information?   Information about the 2019 Novel Coronavirus is  changing daily as we continue to learn more about the outbreak. For the most up to date information, visit the Centers for Disease Control and Prevention (CDC) website: CapitalSpider.co.za.    Need Help Paying Bills, [place]/html/[place]_rental_assistanc.html  [employer] - FeedbackRankings.uy  Disaster [employer] from [employer] - wwwcrv.com  Find Help: Local resources website for food assistance, help paying bills - CakeDeveloper.com.pt  Food Pantries in [facility] - RBLive.tn

## 2018-12-05 NOTE — ED Notes (Addendum)
2150: Pt's son Florina Ou called ER repeatedly despite instructions to review discharge paperwork, with questions regarding pt's diagnosis and after home care. All questions answered, directed son to review paperwork. Son upset that DC instructions were not discussed w/ family. Educated son that pt is a/o x4 and thus responsible for the instructions provided which were explained to her by Dr. Angelene Giovanni in Spanish.

## 2018-12-06 ENCOUNTER — Telehealth: Payer: Self-pay | Admitting: Pediatrics

## 2018-12-06 NOTE — Telephone Encounter (Signed)
Called patient to advise her to call pcp to schedule a ED follow up and s/w son Carrie Knight.  He will relay the msg to mom to call Dr. Laurance Flatten at [place] within 5-7  days.

## 2018-12-06 NOTE — Interdisciplinary (Signed)
04/24 0850 Spoke with PCP Dr.Moore [phone] provided patient information he will call patient for televisit.

## 2018-12-07 LAB — URINE CULTURE

## 2018-12-08 ENCOUNTER — Telehealth: Payer: Self-pay

## 2018-12-08 LAB — URINE CULTURE
Culture Result: 100000 — AB
Culture Result: >100000 — AB

## 2018-12-10 LAB — ECG 12-LEAD
P AXIS: 94 Deg
PR INTERVAL: 168 ms
QRS INTERVAL/DURATION: 94 ms
QT: 336 ms
QTc (Bazett): 381 ms
R AXIS: 16 Deg
R-R INTERVAL AVERAGE: 680 ms
T AXIS: 51 Deg
VENTRICULAR RATE: 88 {beats}/min

## 2018-12-18 ENCOUNTER — Emergency Department
Admission: EM | Admit: 2018-12-18 | Discharge: 2018-12-18 | Disposition: A | Discharge status: Home / Self Care | Payer: No Typology Code available for payment source | Attending: Occupational Therapy | Admitting: Occupational Therapy

## 2018-12-18 ENCOUNTER — Encounter: Payer: Self-pay | Admitting: Occupational Therapy

## 2018-12-18 DIAGNOSIS — Z466 Encounter for fitting and adjustment of urinary device: Secondary | ICD-10-CM | POA: Insufficient documentation

## 2018-12-18 DIAGNOSIS — R32 Unspecified urinary incontinence: Secondary | ICD-10-CM | POA: Insufficient documentation

## 2018-12-18 DIAGNOSIS — I10 Essential (primary) hypertension: Secondary | ICD-10-CM | POA: Insufficient documentation

## 2018-12-18 DIAGNOSIS — E119 Type 2 diabetes mellitus without complications: Secondary | ICD-10-CM | POA: Insufficient documentation

## 2018-12-18 MED ORDER — LIDOCAINE HCL 2 % EX GEL (UROJET) (~~LOC~~)
5.0000 mL | Freq: Once | CUTANEOUS | Status: DC | PRN
Start: 2018-12-18 — End: 2018-12-18

## 2018-12-18 NOTE — ED Provider Notes (Signed)
CHIEF COMPLAINT  Urinary Catheter Problem (needs cath changed and pain with cath) and Suspected Covid-19, Coronavirus (positive 2 weeks ago )      HISTORY OF PRESENT ILLNESS:   Carrie Knight is a 78 year old female who presents with history of bladder surgery/radiation with incontinence.  Gets monthly catheter change.  5 weeks since last change.  No dysuria. Mild vaginal pain.    Home health nurse unable to change catheter given COVID expsour    Pain to vaginal.  Severity 08/23/08.  Radiation none. Worse with touch.Marland Kitchen    REVIEW OF SYSTEMS:  No Fever, Chills, Vomiting, Shortness of breath  All other systems reviewed and negative except as noted above    PAST MEDICAL HISTORY:  Past Medical History:   Diagnosis Date   . Diabetes mellitus (CMS-HCC)    . Heart palpitations    . Hypertension           SURGICAL HISTORY:  Past Surgical History:   Procedure Laterality Date   . BLADDER SURGERY     . HYSTERECTOMY          ALLERGIES:  No Known Allergies      CURRENT MEDICATIONS:   Please see nursing notes    FAMILY HISTORY:  Reviewed and noncontributory:      SOCIAL HISTORY:  - Tobacco     - Alcohol       - Drug use      VITAL SIGNS:  First Vitals   Temperature Heart Rate Respirations Blood pressure (BP) SpO2   12/18/18 1411 12/18/18 1410 12/18/18 1410 12/18/18 1410 12/18/18 1410   98.3 F (36.8 C) 85 16 151/71 98 %         PHYSICAL EXAM:  Gen: Awake, alert, no apparent distress  Head:normocephalic, atraumatic, moist mucous membranes  Neck:Supple, FROM  Eyes: no conjunctival injection ; PERRL  ENT:  Normal appearing nose externally, normal appearing ears externally  CV: normal rate. no murmurs, rubs, or gallops  Skin is warm and well perfused.  Pulmonary:  Normal work of breathing, no audible wheezing or stridor, CTAB  Abd: Nondistended, Soft, nontender, normoactive bowel sounds, no rebound or guarding,  catheter in place, draining yellow urine.  Extremities: No peripheral edema, cyanosis, or clubbing  Skin: No rashes or  significant lesions visible   Neuro: Awake, alert, moving all extremities,, CN III-XII intact, 5/5 strength in bilateral upper extremities, 5/5 strength in bilateral lower extremities  Psych: Conversing appropriately, normal affect, linear thought process    MEDICAL DECISION MAKING:  Carrie Knight is a 78 year old female with history of urinary incontinence who presents with need for replacement.   VS: stable  Exam: benign abdomen. Yellow urine in Foley    Differential Diagnosis Includes: Foley irritation.  No dysuria (only "tugging" from Foley.  No vaginal bleeding, no vaginitis.    Plan:  Foley change    ED Course:  I. I have also reviewed prior records which were summarized in my HPI.    Clinical Impression:  Patient's presentation most consistent with Foley change./incontinence  On reassessment, patient improved    Dispo:  - dc   -  Strict return precautions discussed with the patient. Patient understands these precautions and follow up plan.     Additional ED Course Documentation:        DIAGNOSIS:    ICD-10-CM ICD-9-CM   1. Urinary catheter (Foley) change required Z46.6 V53.6   2. Urinary incontinence, unspecified type R32 788.30  Vernice Jefferson, MD       Vernice Jefferson, MD  12/19/18 2108

## 2018-12-18 NOTE — ED Notes (Addendum)
14 fr foley inserted using sterile technique. About 30 mL of yellow clear urine collected. Pt tolerated well. Medium bag placed. Pt educated about proper use.

## 2018-12-18 NOTE — Discharge Instructions (Signed)
Follow up with your physician as planned.

## 2018-12-20 ENCOUNTER — Emergency Department
Admission: EM | Admit: 2018-12-20 | Discharge: 2018-12-20 | Disposition: A | Discharge status: Home / Self Care | Payer: No Typology Code available for payment source | Attending: Primary Care | Admitting: Primary Care

## 2018-12-20 DIAGNOSIS — Z794 Long term (current) use of insulin: Secondary | ICD-10-CM | POA: Insufficient documentation

## 2018-12-20 DIAGNOSIS — I10 Essential (primary) hypertension: Secondary | ICD-10-CM | POA: Insufficient documentation

## 2018-12-20 DIAGNOSIS — U071 COVID-19: Secondary | ICD-10-CM | POA: Insufficient documentation

## 2018-12-20 DIAGNOSIS — Z79899 Other long term (current) drug therapy: Secondary | ICD-10-CM | POA: Insufficient documentation

## 2018-12-20 DIAGNOSIS — Z7982 Long term (current) use of aspirin: Secondary | ICD-10-CM | POA: Insufficient documentation

## 2018-12-20 DIAGNOSIS — E119 Type 2 diabetes mellitus without complications: Secondary | ICD-10-CM | POA: Insufficient documentation

## 2018-12-20 NOTE — Discharge Instructions (Signed)
Follow up with your primary care doctor in 1-3 days.     You were diagnosed with avoid 15 days. Since you have not had a fever, symptoms resolved, and symptoms appeared over 15 days ago, you do not need to self quarantine anymore.     However since you are over 76 years old, we still recommend to avoid large crowds and try to stay home.     Return to the emgerency department if you have any new or worsening symptoms,  such as fever, chills, bodyaches, shortness of breath, or wheezing. The emergency department is open 24 hours per day, 7 days per week.  You can call us at [phone] with any question or concerns.    You have been seen and evaluated here in the emergency department & have been medically cleared for discharge.  It is VERY IMPORTANT to follow-up with your primary care doctor or [facility] in 1-3 DAYS for another evaluation & further management of your symptoms. The vast majority of illnesses cannot be definitively diagnosed or definitively treated in the emergency department alone. Therefore, for the benefit of your health and for your own safety, you MUST follow-up with your primary healthcare provider(s) for further work-up & regular close follow-ups in order to assure better diagnosis and care.  If you do not have a primary care doctor, call [phone] to obtain information about [facility] clinics.

## 2018-12-20 NOTE — ED Provider Notes (Signed)
Emergency Department Note    Nursing Triage Note:   Chief Complaint   Patient presents with   . Exposure To Covid-19, Coronavirus     Swabbed two weeks ago. Here for recheck to be cleared per public health       HPI:   78 year old female with a PMH significant for htn and diabetes mellitus ,who presents to the Emergency Department to get re-evaulated after testing positive for covid on 12/05/18. Asymptomatic now. Feeling well. She wants to make sure that she does need to self- quarantine anymore. Denies cough, sore throat, wheezing, shortness of breath, abdominal pain, n/v/d, fatigue, or dizziness.       Past Medical History:   Diagnosis Date   . Diabetes mellitus (CMS-HCC)    . Heart palpitations    . Hypertension        Past Surgical History:   Procedure Laterality Date   . BLADDER SURGERY     . HYSTERECTOMY         No family history.    Social History:  Drugs: none    Social History     Tobacco Use   . Smoking status: Never Smoker   . Smokeless tobacco: Never Used   Substance Use Topics   . Alcohol use: Not on file   . Drug use: Not on file       Medications:   Prior to Admission Medications   Prescriptions Last Dose Informant Patient Reported? Taking?   amoxicillin (AMOXIL) 500 MG capsule   Yes No   Sig: Take 500 mg by mouth 3 times daily.   aspirin 81 MG tablet   Yes No   Sig: Take 81 mg by mouth daily.   carvedilol (COREG) 25 MG tablet   Yes No   Sig: Take 25 mg by mouth 2 times daily (with meals).   ibuprofen (MOTRIN) 800 MG tablet   Yes No   Sig: Take 800 mg by mouth every 6 hours as needed for Mild Pain (Pain Score 1-3).   insulin glargine (LANTUS) 100 UNIT/ML injection   Yes No   Sig: Inject 34 Units under the skin daily.   lisinopril (PRINIVIL, ZESTRIL) 40 MG tablet   Yes No   Sig: Take 40 mg by mouth daily.   metFORMIN (GLUCOPHAGE) 1000 MG tablet   Yes No   Sig: Take 1 tablet by mouth 2 times daily.      Facility-Administered Medications: None       Allergies: Patient has no known allergies.    REVIEW OF  SYSTEMS:   Constitutional: -fever  Head: -headache  Eyes: - vision change  ENT: - hearing change  Endo: -polyuria  CV: -chest pain  Resp: -shortness of breath  GI: -vomiting  GU: -hematuria  Back:  - flankpain  Skin: -rash  Neuro: -focal weakness  All other systems reviewed and negative except as noted above    All other systems reviewed and negative unless otherwise noted in the HPI or above. This was done per my custom and practice for systems appropriate to the chief complaint in an emergency department setting and varies depending on the quality of history that the patient is able to provide.      Nursing Notes: Reviewed    VITAL SIGNS:  BP (!) 132/91   Pulse 78   Temp 98.4 F (36.9 C)   Resp 18   Ht 4\' 9"  (1.448 m)   Wt 67.6 kg (149 lb)   SpO2  98%   BMI 32.24 kg/m     PHYSICAL EXAM:  GEN: Awake, Alert, No apparent distress  HEENT: NC/AT. Pupils equally round and reactive to light, moist mucous membranes. Throat clear.   NECK: Supple, full ROM   CVS: Regular rhythm without murmurs, rubs or gallops  CHEST: Breath sounds clear to auscultation bilaterally, no wheezes, rhonchi or rales, no respiratory distress  ABD: + bowel sounds, soft, non-tender, non-distended  BACK: no costovertebral angle tenderness, no midline tenderness   EXT:  warm, well perfused, no cyanosis, no clubbing, no edema, 2+dp pulses  NEURO: Alert and oriented x 3, no focal neuro deficit noted, moves all extremities, sensation intact to all extremities  SKIN: No rash, No jaundice  Psych: appropriate        Impression:  78 year old female with a PMH significant for htn and diabetes mellitus ,who presents to the Emergency Department to get re-evaulated after testing positive for covid on 12/05/18. Consider covid,uri, flu vs pna. Doubt pna since lungs ctab and asymptomatic.       CLINICAL IMPRESSION    ICD-10-CM ICD-9-CM   1. COVID-19 U07.1        Initial ED Plan:  Patient dx 15 days ago. Per cdc guidelines, since patient does not  have fever x 3 days, symptoms resolved, and symptoms appeared over 15 days ago, patient does not need to self quarantine anymore.   Since, you are over 18 years old, we still recommend to avoid large crowds and try to stay home.   Patient states symptoms significantly improved.  Patient to followup with their pmd in 1-3 days.  Plan to discharge home which patient is in agreement with.  All questions answered. ED precautions given.           ED Course:    Workup Summary     There is no data filed.                     Scherrie Merritts, NP  12/20/18 1859

## 2018-12-20 NOTE — ED Notes (Signed)
Christina NP and spanish interpretor talking to the pt.

## 2020-02-05 ENCOUNTER — Emergency Department: Payer: No Typology Code available for payment source

## 2020-02-05 ENCOUNTER — Emergency Department
Admission: EM | Admit: 2020-02-05 | Discharge: 2020-02-06 | Disposition: A | Discharge status: Home / Self Care | Payer: No Typology Code available for payment source | Attending: Emergency Medicine | Admitting: Emergency Medicine

## 2020-02-05 DIAGNOSIS — I10 Essential (primary) hypertension: Secondary | ICD-10-CM | POA: Insufficient documentation

## 2020-02-05 DIAGNOSIS — R197 Diarrhea, unspecified: Secondary | ICD-10-CM | POA: Insufficient documentation

## 2020-02-05 DIAGNOSIS — E119 Type 2 diabetes mellitus without complications: Secondary | ICD-10-CM | POA: Insufficient documentation

## 2020-02-05 DIAGNOSIS — Z8616 Personal history of COVID-19: Secondary | ICD-10-CM | POA: Insufficient documentation

## 2020-02-05 DIAGNOSIS — Z794 Long term (current) use of insulin: Secondary | ICD-10-CM | POA: Insufficient documentation

## 2020-02-05 DIAGNOSIS — K6389 Other specified diseases of intestine: Secondary | ICD-10-CM

## 2020-02-05 LAB — COMPREHENSIVE METABOLIC PANEL, BLOOD
ALT: 12 U/L (ref 7–52)
AST: 14 U/L (ref 13–39)
Albumin: 3.9 G/DL (ref 3.7–5.3)
Alk Phos: 109 U/L — ABNORMAL HIGH (ref 34–104)
BUN: 22 mg/dL (ref 7–25)
Bilirubin, Total: 0.3 mg/dL (ref 0.0–1.4)
CO2: 22 mmol/L (ref 21–31)
Calcium: 8.9 mg/dL (ref 8.6–10.3)
Chloride: 104 mmol/L (ref 98–107)
Creat: 1 mg/dL (ref 0.6–1.2)
Electrolyte Balance: 8 mmol/L (ref 2–12)
Glucose: 145 mg/dL — ABNORMAL HIGH (ref 85–125)
Potassium: 4.5 mmol/L (ref 3.5–5.1)
Protein, Total: 6.5 G/DL (ref 6.0–8.3)
Sodium: 134 mmol/L — ABNORMAL LOW (ref 136–145)
eGFR - high estimate: >60 (ref >59)
eGFR - low estimate: 54 — ABNORMAL LOW (ref >59)

## 2020-02-05 LAB — CBC WITH DIFF, BLOOD
ANC automated: 4.2 10*3/uL (ref 2.0–8.1)
Basophils %: 0.7 %
Basophils Absolute: 0.1 10*3/uL (ref 0.0–0.2)
Eosinophils %: 3.5 %
Eosinophils Absolute: 0.2 10*3/uL (ref 0.0–0.5)
Hematocrit: 30 % — ABNORMAL LOW (ref 34.0–44.0)
Hgb: 10 G/DL — ABNORMAL LOW (ref 11.5–15.0)
Lymphocytes %: 27.5 %
Lymphocytes Absolute: 1.9 10*3/uL (ref 0.9–3.3)
MCH: 30.1 PG (ref 27.0–33.5)
MCHC: 33.3 G/DL (ref 32.0–35.5)
MCV: 90.4 FL (ref 81.5–97.0)
MPV: 7.3 FL (ref 7.2–11.7)
Monocytes %: 8.9 %
Monocytes Absolute: 0.6 10*3/uL (ref 0.0–0.8)
Neutrophils % (A): 59.4 %
PLT Count: 351 10*3/uL (ref 150–400)
RBC: 3.32 10*6/uL — ABNORMAL LOW (ref 3.70–5.00)
RDW-CV: 13 % (ref 11.6–14.4)
White Bld Cell Count: 7.1 10*3/uL (ref 4.0–10.5)

## 2020-02-05 LAB — LIPASE, BLOOD: Lipase: 41 U/L (ref 11–82)

## 2020-02-05 LAB — GLUCOSE, POINT OF CARE: Glucose, Point of Care: 178 MG/DL — ABNORMAL HIGH (ref 70–125)

## 2020-02-05 MED ORDER — SODIUM CHLORIDE 0.9 % IJ SOLN (CUSTOM)
50.0000 mL | Freq: Once | INTRAMUSCULAR | Status: DC | PRN
Start: 2020-02-05 — End: 2020-02-06

## 2020-02-05 MED ORDER — LACTATED RINGERS IV BOLUS (~~LOC~~)
1000.0000 mL | Freq: Once | INTRAVENOUS | Status: DC
Start: 2020-02-05 — End: 2020-02-06

## 2020-02-05 MED ORDER — SODIUM CHLORIDE FLUSH 0.9 % IV SOLN
10.0000 mL | Freq: Once | INTRAVENOUS | Status: DC | PRN
Start: 2020-02-05 — End: 2020-02-06

## 2020-02-05 MED ORDER — IOHEXOL 350 MG/ML IV SOLN
100.0000 mL | Freq: Once | INTRAVENOUS | Status: AC
Start: 2020-02-05 — End: 2020-02-05
  Administered 2020-02-05: 100 mL via INTRAVENOUS

## 2020-02-05 NOTE — Discharge Instructions (Signed)
If a stool sample was collected, results will be available in 3 days via My CHart.  Your doctor will also follow up results and contact you if positive.    You can take immodium for SEVERE diarrhea.  More than 5 episodes daily.  If less than 5 episodes, drink water to stay hydrated and allow symptoms to pass, usually in 2-3 days.    Return to ED for blood in stool, fevers, or severe abdominal pain.

## 2020-02-05 NOTE — ED Notes (Signed)
Pt to EV-3 for PO consumption and attempted stool sample

## 2020-02-05 NOTE — ED Provider Notes (Signed)
CHIEF COMPLAINT  Diarrhea (x 2 weeks. occurs after eating)      HISTORY OF PRESENT ILLNESS:   Carrie Knight is a 79 year old female who presents with diarrhea for the past 3 weeks every time she tries to eat.    Denies blood in diarrhea. States it is yellow with a strong smell. Started after she had stress with her husbands health. Last tried to eat this AM tortillas, beans, water and immediately went to bathroom. Denies vomiting. Denies recent sick contacts or recent travel. Denies pain or dizziness. Endorses weakness. Diarrhea 2-3 x per day. Denies recent antibiotic usage.  Boiled herbs and helped for just a little bit.    Feet had been swollen so not walking as much this past week, 2 days better but still cannot walk because she is weak.    REVIEW OF SYSTEMS:  Constitutional: - fever, chills  Head: - headache  CV: - chest pain  Resp: - shortness of breath  GI: - vomiting    All other systems reviewed and negative except as noted above    PAST MEDICAL HISTORY:  Past Medical History:   Diagnosis Date   . Diabetes mellitus (CMS-HCC)    . Heart palpitations    . Hypertension       There are no problems to display for this patient.  Hx of COVID19 dx 12/05/19    SURGICAL HISTORY:  Past Surgical History:   Procedure Laterality Date   . BLADDER SURGERY     . HYSTERECTOMY            ALLERGIES:  No Known Allergies        CURRENT MEDICATIONS:   Please see nursing notes    FAMILY HISTORY:  Reviewed and considered non-contributory      SOCIAL HISTORY:  Tobacco: Denies  Alcohol: Denies  Drug use: Denies    VITAL SIGNS:  First Vitals [02/05/20 1715]   Temp Heart Rate Respirations Blood pressure (BP) SpO2   -- 84 16 153/59 97 %       PHYSICAL EXAM:  General: Awake, Alert , appears to be in no apparent distress   Head: Normocephalic, atraumatic   Eyes: No scleral icterus, no conjunctival injection   ENT: Normal appearing ears externally, normal appearing nose externally   Neck: Supple, no tracheal deviation   Respiratory:  Normal effort, no audible stridor, CTAB  Cardiovascular: Normal rate, regular rhythm, warm and well perfused        Abdomen: Soft and nontender  Skin: No jaundice, No rash   Extremities: No edema  Neuro: Face symmetric, normal speech    MEDICAL DECISION MAKING:  Carrie Knight is a 79 year old female who presents with diarrhea for 3 weeks. Differential diagnosis includes diverticulitis, gasatroenteritis, C. dif. Will obtain CBC, CMP, lipase, C dif,     I have reviewed the patient's labs which were essentially unremarkable with normal cbc and glucose 145.  . I reviewed the patient's imaging which showed Bowel: Gastric wall thickening may reflect its underdistended state versus developing gastritis. There is slight mural thickening of the sigmoid colon with some scattered mesenteric stranding in the pelvis. No clear sigmoid diverticulosis. Constellation of findings could instead suggests mild enterocolitis.. I have also reviewed prior records which were summarized in my HPI.    ED COURSE:  Workup Summary     There is no data filed.        Workup Summary  Value   Comment By Time       CBC, CMP, Lipase WNL Linton Rump, Alexa 06/24 2015           Family reports sx of diarrhea occur minutes after eating.  Will attempt po challenge here to see if a stool sample can be collected.        DIAGNOSIS:      ICD-10-CM ICD-9-CM   1. Diarrhea, unspecified type  R19.7 787.91   2. Type 2 diabetes mellitus without complication, with long-term current use of insulin (CMS-HCC)  E11.9 250.00    Z79.4 V58.67     ATTENDING ATTESTATION:  I was physically present with the Medical Student during the performance of the history and examination and we jointly participated in medical decision-making.  I reviewed and agree with the findings and plan as documented by the Medical Student.  My additions and revisions are included in the record as necessary.    Guy Franco, MD       Rolland Porter, MD  02/09/20 [phone]

## 2020-02-12 ENCOUNTER — Emergency Department
Admission: EM | Admit: 2020-02-12 | Discharge: 2020-02-13 | Disposition: A | Discharge status: Home / Self Care | Payer: No Typology Code available for payment source | Attending: Emergency Medical Services | Admitting: Emergency Medical Services

## 2020-02-12 ENCOUNTER — Emergency Department: Payer: No Typology Code available for payment source

## 2020-02-12 DIAGNOSIS — Z791 Long term (current) use of non-steroidal anti-inflammatories (NSAID): Secondary | ICD-10-CM | POA: Insufficient documentation

## 2020-02-12 DIAGNOSIS — M160 Bilateral primary osteoarthritis of hip: Secondary | ICD-10-CM

## 2020-02-12 DIAGNOSIS — R0781 Pleurodynia: Secondary | ICD-10-CM

## 2020-02-12 DIAGNOSIS — R55 Syncope and collapse: Secondary | ICD-10-CM

## 2020-02-12 DIAGNOSIS — W1830XA Fall on same level, unspecified, initial encounter: Secondary | ICD-10-CM

## 2020-02-12 DIAGNOSIS — Z79899 Other long term (current) drug therapy: Secondary | ICD-10-CM | POA: Insufficient documentation

## 2020-02-12 DIAGNOSIS — S0990XA Unspecified injury of head, initial encounter: Secondary | ICD-10-CM | POA: Insufficient documentation

## 2020-02-12 DIAGNOSIS — I10 Essential (primary) hypertension: Secondary | ICD-10-CM | POA: Insufficient documentation

## 2020-02-12 DIAGNOSIS — Y9389 Activity, other specified: Secondary | ICD-10-CM | POA: Insufficient documentation

## 2020-02-12 DIAGNOSIS — M8580 Other specified disorders of bone density and structure, unspecified site: Secondary | ICD-10-CM | POA: Insufficient documentation

## 2020-02-12 DIAGNOSIS — E119 Type 2 diabetes mellitus without complications: Secondary | ICD-10-CM | POA: Insufficient documentation

## 2020-02-12 DIAGNOSIS — Z7982 Long term (current) use of aspirin: Secondary | ICD-10-CM | POA: Insufficient documentation

## 2020-02-12 DIAGNOSIS — Y998 Other external cause status: Secondary | ICD-10-CM | POA: Insufficient documentation

## 2020-02-12 DIAGNOSIS — Z043 Encounter for examination and observation following other accident: Secondary | ICD-10-CM

## 2020-02-12 DIAGNOSIS — Z794 Long term (current) use of insulin: Secondary | ICD-10-CM | POA: Insufficient documentation

## 2020-02-12 DIAGNOSIS — F10239 Alcohol dependence with withdrawal, unspecified: Secondary | ICD-10-CM

## 2020-02-12 DIAGNOSIS — I7090 Unspecified atherosclerosis: Secondary | ICD-10-CM

## 2020-02-12 DIAGNOSIS — R079 Chest pain, unspecified: Secondary | ICD-10-CM

## 2020-02-12 DIAGNOSIS — Y9289 Other specified places as the place of occurrence of the external cause: Secondary | ICD-10-CM | POA: Insufficient documentation

## 2020-02-12 DIAGNOSIS — W010XXA Fall on same level from slipping, tripping and stumbling without subsequent striking against object, initial encounter: Secondary | ICD-10-CM | POA: Insufficient documentation

## 2020-02-12 DIAGNOSIS — R0789 Other chest pain: Secondary | ICD-10-CM

## 2020-02-12 DIAGNOSIS — M47816 Spondylosis without myelopathy or radiculopathy, lumbar region: Secondary | ICD-10-CM

## 2020-02-12 LAB — COMPREHENSIVE METABOLIC PANEL, BLOOD
ALT: 11 U/L (ref 7–52)
AST: 14 U/L (ref 13–39)
Albumin: 4 G/DL (ref 3.7–5.3)
Alk Phos: 92 U/L (ref 34–104)
BUN: 18 mg/dL (ref 7–25)
Bilirubin, Total: 0.3 mg/dL (ref 0.0–1.4)
CO2: 21 mmol/L (ref 21–31)
Calcium: 9.3 mg/dL (ref 8.6–10.3)
Chloride: 103 mmol/L (ref 98–107)
Creat: 1.2 mg/dL (ref 0.6–1.2)
Electrolyte Balance: 9 mmol/L (ref 2–12)
Glucose: 175 mg/dL — ABNORMAL HIGH (ref 85–125)
Potassium: 4.4 mmol/L (ref 3.5–5.1)
Protein, Total: 6.8 G/DL (ref 6.0–8.3)
Sodium: 133 mmol/L — ABNORMAL LOW (ref 136–145)
eGFR - high estimate: 53 — ABNORMAL LOW (ref >59)
eGFR - low estimate: 44 — ABNORMAL LOW (ref >59)

## 2020-02-12 LAB — CBC WITH DIFF, BLOOD
ANC automated: 6.1 10*3/uL (ref 2.0–8.1)
Basophils %: 0.5 %
Basophils Absolute: 0 10*3/uL (ref 0.0–0.2)
Eosinophils %: 2.8 %
Eosinophils Absolute: 0.2 10*3/uL (ref 0.0–0.5)
Hematocrit: 30.2 % — ABNORMAL LOW (ref 34.0–44.0)
Hgb: 10.2 G/DL — ABNORMAL LOW (ref 11.5–15.0)
Lymphocytes %: 18.4 %
Lymphocytes Absolute: 1.6 10*3/uL (ref 0.9–3.3)
MCH: 30.6 PG (ref 27.0–33.5)
MCHC: 33.8 G/DL (ref 32.0–35.5)
MCV: 90.6 FL (ref 81.5–97.0)
MPV: 7.6 FL (ref 7.2–11.7)
Monocytes %: 8.7 %
Monocytes Absolute: 0.8 10*3/uL (ref 0.0–0.8)
Neutrophils % (A): 69.6 %
PLT Count: 350 10*3/uL (ref 150–400)
RBC: 3.34 10*6/uL — ABNORMAL LOW (ref 3.70–5.00)
RDW-CV: 12.7 % (ref 11.6–14.4)
White Bld Cell Count: 8.7 10*3/uL (ref 4.0–10.5)

## 2020-02-12 LAB — LIPASE, BLOOD: Lipase: 28 U/L (ref 11–82)

## 2020-02-12 LAB — TROPONIN I, HIGH SENSITIVITY
Troponin I, High Sensitivity: 7 ng/L (ref 0–15)
Troponin I, High Sensitivity: 8 ng/L (ref 0–15)

## 2020-02-12 MED ORDER — LIDOCAINE 5 % EX PTCH
1.0000 | MEDICATED_PATCH | CUTANEOUS | Status: DC
Start: 2020-02-13 — End: 2020-02-13

## 2020-02-12 MED ORDER — METHOCARBAMOL 500 MG OR TABS
500.0000 mg | ORAL_TABLET | Freq: Once | ORAL | Status: AC
Start: 2020-02-12 — End: 2020-02-12
  Administered 2020-02-12 (×2): 500 mg via ORAL
  Filled 2020-02-12: qty 1

## 2020-02-12 MED ORDER — IBUPROFEN 600 MG OR TABS
600.0000 mg | ORAL_TABLET | Freq: Once | ORAL | Status: AC
Start: 2020-02-12 — End: 2020-02-12
  Administered 2020-02-12 (×2): 600 mg via ORAL
  Filled 2020-02-12: qty 1

## 2020-02-12 MED ORDER — ACETAMINOPHEN 325 MG PO TABS
650.0000 mg | ORAL_TABLET | Freq: Once | ORAL | Status: AC
Start: 2020-02-12 — End: 2020-02-12
  Administered 2020-02-12 (×2): 650 mg via ORAL
  Filled 2020-02-12: qty 2

## 2020-02-12 MED ORDER — LIDOCAINE 4 % EX PTCH
1.0000 | MEDICATED_PATCH | CUTANEOUS | 0 refills | Status: DC
Start: 2020-02-12 — End: 2020-05-03

## 2020-02-12 NOTE — Discharge Instructions (Addendum)
Thank you for allowing [place] to care for you today at [facility]. Today, you were seen after a fall. Based on your workup here in the emergency department, you were deemed stable for discharge. Your imaging studies did not show any emergent findings.    Please follow up with your regular doctor to ensure there are no other medical issues after your emergency department visit today.    Please return to the ER if you develop fevers, worsening of your symptoms, or have any other concerns.      If you have any outstanding lab or imaging studies at the time of your discharge, the phone number to contact us here in the emergency department is [phone].     If you were prescribed any narcotic or sedative medications here in the ER, please do not drive after taking these medications and do not mix them with alcohol.

## 2020-02-12 NOTE — ED EKG Interpretation (Signed)
ED EKG Interpretation    EKG: Normal Sinus Rhythm with Normal Axis and no ischemic changes.

## 2020-02-12 NOTE — ED Provider Notes (Signed)
[facility] Emergency Department Treatment Record    [place], [place]    Patient: Carrie Knight, MRN [id number], DOB [birth year]    CHIEF COMPLAINT:    Abdominal Pain (upper abd pain s/p mechanical fall onto cement.  Denies head injury)      HISTORY OF PRESENT ILLNESS:   LESLEA VOWLES is a 79 year old female +ground level fall mechanical no LOC, on ASA, no head trauma un witnessed, patient remembers entire event is that she fell to her of this to her right of her abdomen and thorax, patient describes pain as sharp was with the plan no obvious bruising noted patient has not taken medications for pain.  Her patient's family member patient is acting appropriately at this time    Location: epigastric pain,lower right thoracic   Radiation: none  Quality: aching sharp   Severity: mod  Duration: at noon   Context: mechanical fall  Alleviating: rest   Exacerbating: movement   Associated: denies SOB, LOC, head injury    REVIEW OF SYSTEMS:    Constitutional: - fever  CV: + chest pain  Resp: -  shortness of breath  GI:  vomiting  Neuro: - focal weakness    All other systems reviewed and negative except as noted above      PAST MEDICAL HISTORY:  Past Medical History:   Diagnosis Date    Diabetes mellitus (CMS-HCC)     Heart palpitations     Hypertension        History reviewed and verified     SURGICAL HISTORY:  Past Surgical History:   Procedure Laterality Date    BLADDER SURGERY      HYSTERECTOMY           ALLERGIES:  No Known Allergies     CURRENT MEDICATIONS:     Current Facility-Administered Medications:     [START ON 02/13/2020] lidocaine (LIDODERM) 5 % patch 1 patch, 1 patch, Transdermal, Q24H, Smitham, Maximillian, DO    methocarbamol (ROBAXIN) tablet 500 mg, 500 mg, Oral, Once, Smitham, Maximillian, DO    Current Outpatient Medications:     amoxicillin (AMOXIL) 500 MG capsule, Take 500 mg by mouth 3 times daily., Disp: , Rfl:     aspirin 81 MG tablet, Take 81 mg by mouth daily., Disp: , Rfl:     carvedilol  (COREG) 25 MG tablet, Take 25 mg by mouth 2 times daily (with meals)., Disp: , Rfl:     ibuprofen (MOTRIN) 800 MG tablet, Take 800 mg by mouth every 6 hours as needed for Mild Pain (Pain Score 1-3)., Disp: , Rfl:     insulin glargine (LANTUS) 100 UNIT/ML injection, Inject 34 Units under the skin daily., Disp: , Rfl:     lidocaine (ASPERCREME) 4 % patch, Apply 1 patch topically every 24 hours. Leave patch on for 12 hours, then remove for 12 hours., Disp: 30 patch, Rfl: 0    lisinopril (PRINIVIL, ZESTRIL) 40 MG tablet, Take 40 mg by mouth daily., Disp: , Rfl:     metFORMIN (GLUCOPHAGE) 1000 MG tablet, Take 1 tablet by mouth 2 times daily., Disp: , Rfl:        SOCIAL HISTORY:  Social History     Tobacco Use    Smoking status: Never Smoker    Smokeless tobacco: Never Used   Substance Use Topics    Alcohol use: Not on file    Drug use: Not on file       VITAL SIGNS:  Blood Pressure 166/55   Pulse 98   Temperature 99.4 F (37.4 C)   Respiration 18   Height 4' 9"  (1.448 m)   Weight 68 kg (150 lb)   Oxygen Saturation 96%   Body Mass Index 32.46 kg/m       PHYSICAL EXAM:  General: Awake, Alert , appears to be in mild apparent distress   Head: Normocephalic, atraumatic   Eyes: No scleral icterus, no conjunctival injection   ENT: Normal appearing ears externally, normal appearing nose externally   Neck: Supple, no tracheal deviation   Respiratory: Normal effort, no audible stridor , CTAB  Cardiovascular: Normal rate, regular rhythm, warm and well perfused        Abdomen:tenderness to palpation of right thoracic chest wall  Back: - costovertebral angle tenderness  Skin: No jaundice, No rash   Extremities: No edema,   Neuro: Face symmetric, normal speech       RADIOLOGY:  CT Head W/O Contrast   Final Result      No acute intracranial hemorrhage, herniation, or hydrocephalus.       END OF IMPRESSION      X-Ray Pelvis 1 Or 2 Views   Final Result      1.  No acute fracture or dislocation.      X-Ray Chest Frontal And  Lateral   Final Result    No plain film radiographic evidence of acute cardiopulmonary disease identified.          ED LABS:  Labs Reviewed   CBC WITH DIFF, BLOOD - Abnormal; Notable for the following components:       Result Value    RBC 3.34 (*)     Hgb 10.2 (*)     Hematocrit 30.2 (*)     All other components within normal limits   COMPREHENSIVE METABOLIC PANEL, BLOOD - Abnormal; Notable for the following components:    Sodium 133 (*)     Glucose 175 (*)     eGFR - low estimate 44 (*)     eGFR - high estimate 53 (*)     All other components within normal limits   LIPASE, BLOOD   TROPONIN I, HIGH SENSITIVITY   URINALYSIS WITH CULTURE REFLEX, WHEN INDICATED   TROPONIN I, HIGH SENSITIVITY         MEDICAL DECISION MAKING:  TAKEYLA MILLION is a 79 year old female who presents with right rib pain s/p mechanical fall.   symptoms suggestive of noncardiac chest pain. History without high risk features. Consider rib fracture/contusion/PNA    Plan: labs, troponin, EKG, CXR, CT, pain control, serial reassessment  .     ED COURSE  Workup Summary         Value   Comment By Time       IA to JRS: IA to discharge Suchard, Monika Salk, MD 07/02 0020    Troponin I, High Sensitivity Light Green:    Troponin I, High Sensitivity 7   (Reviewed) Smitham, Maximillian, DO 07/01 2008    Troponin I, High Sensitivity Light Green:    Troponin I, High Sensitivity 7   (Reviewed) Smitham, Maximillian, DO 07/01 2008       IMPRESSION: No plain film radiographic evidence of acute cardiopulmonary disease identified. Smitham, Baker, DO 07/01 1922       IMPRESSION: No acute intracranial hemorrhage, herniation, or hydrocephalus.  Crawford, [place], DO 07/01 1610       Patient presents status post ground  level fall, patient has evidence of head trauma were remembers entire event pain concerning for possible rib fracture Smitham, Maximillian, DO 07/01 1806       FAST Scan:  Indication: blunt/penetrating trauma.Findings:   Negative FAST scan.Procedure: A coronal plane of the right upper quadrant was obtained and was negative for anechoic fluid in the right chest, in Morison&#39;s pouch, and in the right paracolic gutter.  Next, a subcostal window of the heart was negative for the presence of free fluid in the pericardial space.  Next, a coronal plane of the left upper quadrant was obtained and was negative for anechoic fluid in the left chest, the splenorenal space, and the left paracolic gutter.  Finally, a suprapubic window was negative for free fluid posterior and lateral to the bladder.  These images were recorded on the digital video recorder for archival purposes. Smitham, Maximillian, DO 07/01 1800               DIAGNOSIS:    ICD-10-CM ICD-9-CM   1. Fall from ground level  W18.30XA E888.9   2. Chest pain, unspecified type  R07.9 786.50   3. Rib pain on right side  R07.81 786.50       Home     Maximillian Smitham, DO  02/12/20  5:37 PM     Smitham, Maximillian, DO  Resident  02/13/20 0201    Attending Attestation: I was present with the resident during the history and exam.  I discussed the case with the resident and agree with the findings and plan as documented by the resident.  My additions or revisions are included in the record.      Emeline Gins, MD  02/13/20 11:17 AM        Dene Gentry, Patience Musca, MD  02/13/20 1121

## 2020-02-13 MED ORDER — LIDOCAINE 5 % EX PTCH
1.0000 | MEDICATED_PATCH | CUTANEOUS | Status: DC
Start: 2020-02-13 — End: 2020-02-13
  Administered 2020-02-13 (×2): 1 via TRANSDERMAL
  Filled 2020-02-13: qty 1

## 2020-02-13 NOTE — ED Notes (Signed)
Patient stable for discharge. Given discharge instructions, prescription and copies of results. Patient verbalized understanding. Vitals stable.

## 2020-02-14 LAB — ECG 12-LEAD
P AXIS: 86 Deg
PR INTERVAL: 176 ms
QRS INTERVAL/DURATION: 86 ms
QT: 329 ms
QTc (Bazett): 381 ms
R AXIS: 0 Deg
R-R INTERVAL AVERAGE: 633 ms
T AXIS: 49 Deg
VENTRICULAR RATE: 94 {beats}/min

## 2020-02-19 NOTE — ED Procedure Note (Signed)
Extended FAST Scan of Abdomen, Cardiac, and Thorax.  Indication: Blunt chest or abdominal trauma with chest or abdominal pain, concern for internal bleeding.  Findings: Negative Extended FAST scan.  Procedure  Abdomen: A coronal plane of the right upper quadrant was obtained and was negative for anechoic fluid in the right chest, in Morison's pouch, and in the right paracolic gutter.   Next, a coronal plane of the left upper quadrant was obtained and was negative for anechoic fluid in the left chest, the splenorenal space, and the left paracolic gutter.  Next a suprapubic window was negative for free fluid posterior and lateral to the bladder.    Cardiac: Next, a subcostal window of the heart was negative for the presence of free fluid in the pericardial space.   Thorax: Finally bilateral thoracic left anterior axillary lines and R mid clavicular lines demonstrated normal lung sliding. Video clips were recorded for archival purposes.  The images were reviewed by the ED attending physician.

## 2020-05-02 ENCOUNTER — Emergency Department: Payer: No Typology Code available for payment source

## 2020-05-02 ENCOUNTER — Inpatient Hospital Stay
Admission: AD | Admit: 2020-05-02 | Discharge: 2020-05-04 | DRG: 309 | Disposition: A | Discharge status: Home / Self Care | Payer: No Typology Code available for payment source | Attending: Emergency Medical Services | Admitting: Emergency Medical Services

## 2020-05-02 DIAGNOSIS — R42 Dizziness and giddiness: Secondary | ICD-10-CM

## 2020-05-02 DIAGNOSIS — E875 Hyperkalemia: Secondary | ICD-10-CM | POA: Diagnosis present

## 2020-05-02 DIAGNOSIS — I4891 Unspecified atrial fibrillation: Secondary | ICD-10-CM

## 2020-05-02 DIAGNOSIS — K529 Noninfective gastroenteritis and colitis, unspecified: Secondary | ICD-10-CM | POA: Diagnosis present

## 2020-05-02 DIAGNOSIS — R0602 Shortness of breath: Secondary | ICD-10-CM

## 2020-05-02 DIAGNOSIS — I10 Essential (primary) hypertension: Secondary | ICD-10-CM

## 2020-05-02 DIAGNOSIS — Z87440 Personal history of urinary (tract) infections: Secondary | ICD-10-CM

## 2020-05-02 DIAGNOSIS — I129 Hypertensive chronic kidney disease with stage 1 through stage 4 chronic kidney disease, or unspecified chronic kidney disease: Secondary | ICD-10-CM | POA: Diagnosis present

## 2020-05-02 DIAGNOSIS — E1122 Type 2 diabetes mellitus with diabetic chronic kidney disease: Secondary | ICD-10-CM | POA: Diagnosis present

## 2020-05-02 DIAGNOSIS — Z794 Long term (current) use of insulin: Secondary | ICD-10-CM

## 2020-05-02 DIAGNOSIS — Z20822 Contact with and (suspected) exposure to covid-19: Secondary | ICD-10-CM | POA: Diagnosis present

## 2020-05-02 DIAGNOSIS — I951 Orthostatic hypotension: Secondary | ICD-10-CM | POA: Diagnosis present

## 2020-05-02 DIAGNOSIS — R197 Diarrhea, unspecified: Secondary | ICD-10-CM

## 2020-05-02 DIAGNOSIS — I480 Paroxysmal atrial fibrillation: Principal | ICD-10-CM | POA: Diagnosis present

## 2020-05-02 DIAGNOSIS — N179 Acute kidney failure, unspecified: Secondary | ICD-10-CM | POA: Diagnosis present

## 2020-05-02 DIAGNOSIS — E1129 Type 2 diabetes mellitus with other diabetic kidney complication: Secondary | ICD-10-CM | POA: Diagnosis present

## 2020-05-02 DIAGNOSIS — D649 Anemia, unspecified: Secondary | ICD-10-CM

## 2020-05-02 DIAGNOSIS — R339 Retention of urine, unspecified: Secondary | ICD-10-CM | POA: Diagnosis present

## 2020-05-02 DIAGNOSIS — D631 Anemia in chronic kidney disease: Secondary | ICD-10-CM | POA: Diagnosis present

## 2020-05-02 DIAGNOSIS — R778 Other specified abnormalities of plasma proteins: Secondary | ICD-10-CM | POA: Diagnosis present

## 2020-05-02 DIAGNOSIS — R2689 Other abnormalities of gait and mobility: Secondary | ICD-10-CM

## 2020-05-02 DIAGNOSIS — N184 Chronic kidney disease, stage 4 (severe): Secondary | ICD-10-CM | POA: Diagnosis present

## 2020-05-02 DIAGNOSIS — E861 Hypovolemia: Secondary | ICD-10-CM | POA: Diagnosis present

## 2020-05-02 LAB — BASIC METABOLIC PANEL, BLOOD
BUN: 20 mg/dL (ref 7–25)
CO2: 17 mmol/L — ABNORMAL LOW (ref 21–31)
Calcium: 9.2 mg/dL (ref 8.6–10.3)
Chloride: 113 mmol/L — ABNORMAL HIGH (ref 98–107)
Creat: 0.9 mg/dL (ref 0.6–1.2)
Electrolyte Balance: 5 mmol/L (ref 2–12)
Glucose: 78 mg/dL — ABNORMAL LOW (ref 85–125)
Potassium: 5.1 mmol/L (ref 3.5–5.1)
Sodium: 135 mmol/L — ABNORMAL LOW (ref 136–145)
eGFR - high estimate: >60 (ref >59)
eGFR - low estimate: >60 (ref >59)

## 2020-05-02 LAB — ECG 12-LEAD
P AXIS: 999 Deg
PR INTERVAL: 0 ms
QRS INTERVAL/DURATION: 82 ms
QT: 267 ms
QTc (Bazett): 350 ms
R AXIS: 182 Deg
R-R INTERVAL AVERAGE: 414 ms
T AXIS: 56 Deg
VENTRICULAR RATE: 144 {beats}/min

## 2020-05-02 LAB — COMPREHENSIVE METABOLIC PANEL, BLOOD
ALT: 22 U/L (ref 7–52)
AST: 28 U/L (ref 13–39)
Albumin: 4.1 G/DL (ref 3.7–5.3)
Alk Phos: 91 U/L (ref 34–104)
BUN: 24 mg/dL (ref 7–25)
Bilirubin, Total: 0.4 mg/dL (ref 0.0–1.4)
CO2: 17 mmol/L — ABNORMAL LOW (ref 21–31)
Calcium: 9.4 mg/dL (ref 8.6–10.3)
Chloride: 110 mmol/L — ABNORMAL HIGH (ref 98–107)
Creat: 1.1 mg/dL (ref 0.6–1.2)
Electrolyte Balance: 6 mmol/L (ref 2–12)
Glucose: 103 mg/dL (ref 85–125)
Potassium: 6.7 mmol/L (ref 3.5–5.1)
Protein, Total: 7.1 G/DL (ref 6.0–8.3)
Sodium: 133 mmol/L — ABNORMAL LOW (ref 136–145)
eGFR - high estimate: 58 — ABNORMAL LOW (ref >59)
eGFR - low estimate: 48 — ABNORMAL LOW (ref >59)

## 2020-05-02 LAB — URINALYSIS WITH CULTURE REFLEX, WHEN INDICATED
Bilirubin, UA: NEGATIVE
Glucose, UA: 50 MG/DL — AB
Ketones, UA: NEGATIVE MG/DL
Nitrite, UA: POSITIVE — AB
Protein, UA: NEGATIVE MG/DL
RBC, UA: 2 #/HPF (ref 0–3)
Specific Grav, UA: 1.004 (ref 1.003–1.030)
Urobilinogen, UA: <2 MG/DL (ref <2.0)
WBC, UA: 4 #/HPF (ref 0–5)
pH, UA: 5 (ref 5.0–8.0)

## 2020-05-02 LAB — BNP, BLOOD
BNP: 137 pg/mL — ABNORMAL HIGH (ref 0–100)
BNP: 158 pg/mL — ABNORMAL HIGH (ref 0–100)

## 2020-05-02 LAB — CBC WITH DIFF, BLOOD
ANC automated: 5.2 10*3/uL (ref 2.0–8.1)
Basophils %: 0.5 %
Basophils Absolute: 0 10*3/uL (ref 0.0–0.2)
Eosinophils %: 2 %
Eosinophils Absolute: 0.1 10*3/uL (ref 0.0–0.5)
Hematocrit: 32.4 % — ABNORMAL LOW (ref 34.0–44.0)
Hgb: 10.9 G/DL — ABNORMAL LOW (ref 11.5–15.0)
Lymphocytes %: 17.2 %
Lymphocytes Absolute: 1.2 10*3/uL (ref 0.9–3.3)
MCH: 29.7 PG (ref 27.0–33.5)
MCHC: 33.6 G/DL (ref 32.0–35.5)
MCV: 88.5 FL (ref 81.5–97.0)
MPV: 7.4 FL (ref 7.2–11.7)
Monocytes %: 7.2 %
Monocytes Absolute: 0.5 10*3/uL (ref 0.0–0.8)
Neutrophils % (A): 73.1 %
PLT Count: 290 10*3/uL (ref 150–400)
RBC: 3.66 10*6/uL — ABNORMAL LOW (ref 3.70–5.00)
RDW-CV: 15.1 % — ABNORMAL HIGH (ref 11.6–14.4)
White Bld Cell Count: 7.2 10*3/uL (ref 4.0–10.5)

## 2020-05-02 LAB — COVID-19, FLU A/B PANEL (POC)
COVID-19 Result: NOT DETECTED
Influenza A, PCR: NOT DETECTED
Influenza B, PCR: NOT DETECTED
Respiratory Virus Comment: NOT DETECTED

## 2020-05-02 LAB — K CRT VAL CALL

## 2020-05-02 LAB — MAGNESIUM, BLOOD: Magnesium: 1.5 mg/dL — ABNORMAL LOW (ref 1.9–2.7)

## 2020-05-02 LAB — TROPONIN I, HIGH SENSITIVITY
Troponin I, High Sensitivity: 25 ng/L — ABNORMAL HIGH (ref 0–15)
Troponin I, High Sensitivity: 24 ng/L — ABNORMAL HIGH (ref 0–15)

## 2020-05-02 LAB — LIPASE, BLOOD: Lipase: 23 U/L (ref 11–82)

## 2020-05-02 LAB — GLUCOSE, POINT OF CARE
Glucose, Point of Care: 83 MG/DL (ref 70–125)
Glucose, Point of Care: 172 MG/DL — ABNORMAL HIGH (ref 70–125)

## 2020-05-02 LAB — LACTATE, BLOOD: Lactic Acid: 0.9 mmol/L (ref 0.5–2.0)

## 2020-05-02 LAB — PHOSPHORUS, BLOOD: Phosphorus: 3.9 MG/DL (ref 2.5–5.0)

## 2020-05-02 MED ORDER — GLUCAGON HCL (DIAGNOSTIC) 1 MG IJ SOLR
1.0000 mg | INTRAMUSCULAR | Status: DC | PRN
Start: 2020-05-02 — End: 2020-05-04

## 2020-05-02 MED ORDER — SODIUM CHLORIDE FLUSH 0.9 % IV SOLN
10.0000 mL | INTRAVENOUS | Status: DC | PRN
Start: 2020-05-02 — End: 2020-05-03

## 2020-05-02 MED ORDER — ONDANSETRON HCL 4 MG OR TABS
4.0000 mg | ORAL_TABLET | Freq: Four times a day (QID) | ORAL | Status: DC | PRN
Start: 2020-05-02 — End: 2020-05-04

## 2020-05-02 MED ORDER — DEXTROSE 10 % IV SOLN
50.0000 mL/h | INTRAVENOUS | Status: DC | PRN
Start: 2020-05-02 — End: 2020-05-04

## 2020-05-02 MED ORDER — METOCLOPRAMIDE HCL 5 MG/ML IJ SOLN
10.0000 mg | Freq: Four times a day (QID) | INTRAMUSCULAR | Status: DC | PRN
Start: 2020-05-02 — End: 2020-05-04

## 2020-05-02 MED ORDER — ONDANSETRON HCL 4 MG/2ML IV SOLN
4.0000 mg | Freq: Four times a day (QID) | INTRAMUSCULAR | Status: DC | PRN
Start: 2020-05-02 — End: 2020-05-04

## 2020-05-02 MED ORDER — MAGNESIUM SULFATE 4 GM/50ML IV SOLN
4.0000 g | Freq: Once | INTRAVENOUS | Status: AC
Start: 2020-05-02 — End: 2020-05-02
  Administered 2020-05-02: 4 g via INTRAVENOUS
  Filled 2020-05-02: qty 50

## 2020-05-02 MED ORDER — LIDOCAINE HCL 2 % EX GEL (UROJET) (~~LOC~~)
5.0000 mL | Freq: Once | CUTANEOUS | Status: DC | PRN
Start: 2020-05-02 — End: 2020-05-04
  Filled 2020-05-02: qty 5

## 2020-05-02 MED ORDER — INSULIN LISPRO (HUMAN) 100 UNIT/ML SC SOLN (~~LOC~~)
1.0000 [IU] | Freq: Every evening | SUBCUTANEOUS | Status: DC
Start: 2020-05-02 — End: 2020-05-04

## 2020-05-02 MED ORDER — SODIUM CHLORIDE FLUSH 0.9 % IV SOLN
10.0000 mL | INTRAVENOUS | Status: DC | PRN
Start: 2020-05-02 — End: 2020-05-02

## 2020-05-02 MED ORDER — MECLIZINE HCL 12.5 MG OR TABS
25.0000 mg | ORAL_TABLET | Freq: Once | ORAL | Status: DC
Start: 2020-05-02 — End: 2020-05-02

## 2020-05-02 MED ORDER — INSULIN GLARGINE 100 UNIT/ML SC SOLN
12.0000 [IU] | Freq: Every evening | SUBCUTANEOUS | Status: DC
Start: 2020-05-02 — End: 2020-05-04
  Administered 2020-05-02 – 2020-05-03 (×2): 12 [IU] via SUBCUTANEOUS
  Filled 2020-05-02 (×2): qty 12

## 2020-05-02 MED ORDER — DEXTROSE 50 % IV SOLN
50.0000 mL | Freq: Once | INTRAVENOUS | Status: AC
Start: 2020-05-02 — End: 2020-05-02
  Administered 2020-05-02 (×2): 25 g via INTRAVENOUS

## 2020-05-02 MED ORDER — SODIUM CHLORIDE 0.9 % IV BOLUS (~~LOC~~)
1000.0000 mL | INJECTION | Freq: Once | INTRAVENOUS | Status: AC
Start: 2020-05-02 — End: 2020-05-02
  Administered 2020-05-02 (×2): 1000 mL via INTRAVENOUS

## 2020-05-02 MED ORDER — ASPIRIN EC 81 MG OR TBEC (CUSTOM)
81.0000 mg | DELAYED_RELEASE_TABLET | Freq: Every day | ORAL | Status: DC
Start: 2020-05-02 — End: 2020-05-04
  Administered 2020-05-02 – 2020-05-03 (×2): 81 mg via ORAL
  Filled 2020-05-02 (×2): qty 1

## 2020-05-02 MED ORDER — INSULIN LISPRO (HUMAN) 100 UNIT/ML SC SOLN (~~LOC~~)
2.0000 [IU] | Freq: Three times a day (TID) | SUBCUTANEOUS | Status: DC
Start: 2020-05-02 — End: 2020-05-04
  Administered 2020-05-03: 4 [IU] via SUBCUTANEOUS

## 2020-05-02 MED ORDER — ENOXAPARIN SODIUM 40 MG/0.4ML SC SOLN
40.0000 mg | Freq: Every day | SUBCUTANEOUS | Status: DC
Start: 2020-05-02 — End: 2020-05-02

## 2020-05-02 MED ORDER — DEXTROSE 50 % IV SOLN
50.0000 mL | INTRAVENOUS | Status: DC | PRN
Start: 2020-05-02 — End: 2020-05-04

## 2020-05-02 MED ORDER — SENNA 8.6 MG OR TABS
8.6000 mg | ORAL_TABLET | Freq: Every evening | ORAL | Status: DC
Start: 2020-05-02 — End: 2020-05-02

## 2020-05-02 MED ORDER — INSULIN LISPRO (HUMAN) 100 UNIT/ML SC SOLN (~~LOC~~)
10.0000 [IU] | Freq: Once | SUBCUTANEOUS | Status: AC
Start: 2020-05-02 — End: 2020-05-02
  Administered 2020-05-02: 10 [IU] via INTRAVENOUS

## 2020-05-02 MED ORDER — HEPARIN SODIUM (PORCINE) 5000 UNIT/ML IJ SOLN
5000.0000 [IU] | Freq: Three times a day (TID) | INTRAMUSCULAR | Status: DC
Start: 2020-05-02 — End: 2020-05-04
  Administered 2020-05-02 – 2020-05-04 (×6): 5000 [IU] via SUBCUTANEOUS
  Filled 2020-05-02 (×6): qty 1

## 2020-05-02 MED ORDER — LOPERAMIDE HCL 2 MG OR CAPS
2.0000 mg | ORAL_CAPSULE | Freq: Once | ORAL | Status: AC
Start: 2020-05-02 — End: 2020-05-02
  Administered 2020-05-02: 2 mg via ORAL
  Filled 2020-05-02: qty 1

## 2020-05-02 MED ORDER — SODIUM CHLORIDE FLUSH 0.9 % IV SOLN
10.0000 mL | Freq: Once | INTRAVENOUS | Status: DC
Start: 2020-05-02 — End: 2020-05-02

## 2020-05-02 MED ORDER — ACETAMINOPHEN 325 MG PO TABS
650.0000 mg | ORAL_TABLET | ORAL | Status: DC | PRN
Start: 2020-05-02 — End: 2020-05-04

## 2020-05-02 MED ORDER — ONDANSETRON HCL 4 MG/2ML IV SOLN
4.0000 mg | Freq: Once | INTRAMUSCULAR | Status: AC
Start: 2020-05-02 — End: 2020-05-02
  Administered 2020-05-02 (×2): 4 mg via INTRAVENOUS
  Filled 2020-05-02: qty 2

## 2020-05-02 MED ORDER — METOPROLOL TARTRATE 25 MG OR TABS
25.0000 mg | ORAL_TABLET | Freq: Two times a day (BID) | ORAL | Status: DC
Start: 2020-05-02 — End: 2020-05-04
  Administered 2020-05-02 – 2020-05-04 (×6): 25 mg via ORAL
  Filled 2020-05-02 (×5): qty 1

## 2020-05-02 MED ORDER — METOPROLOL TARTRATE 5 MG/5ML IV SOLN
5.0000 mg | Freq: Once | INTRAVENOUS | Status: AC
Start: 2020-05-02 — End: 2020-05-02
  Administered 2020-05-02 (×2): 5 mg via INTRAVENOUS
  Filled 2020-05-02: qty 5

## 2020-05-02 MED ORDER — ONDANSETRON 4 MG OR TBDP
4.0000 mg | ORAL_TABLET | Freq: Once | ORAL | Status: DC
Start: 2020-05-02 — End: 2020-05-02

## 2020-05-02 MED ORDER — PERFLUTREN LIPID MICROSPHERE 1.1 MG/ML IV SUSP
0.4000 mL | INTRAVENOUS | Status: DC | PRN
Start: 2020-05-02 — End: 2020-05-03

## 2020-05-02 MED ORDER — SODIUM CHLORIDE 0.9 % IV BOLUS (~~LOC~~)
1000.0000 mL | INJECTION | Freq: Once | INTRAVENOUS | Status: DC
Start: 2020-05-02 — End: 2020-05-02

## 2020-05-02 MED ORDER — CARVEDILOL 12.5 MG OR TABS
25.0000 mg | ORAL_TABLET | Freq: Two times a day (BID) | ORAL | Status: DC
Start: 2020-05-02 — End: 2020-05-02

## 2020-05-02 MED ORDER — PERFLUTREN LIPID MICROSPHERE 1.1 MG/ML IV SUSP
0.4000 mL | INTRAVENOUS | Status: DC | PRN
Start: 2020-05-02 — End: 2020-05-02

## 2020-05-02 MED ORDER — SODIUM CHLORIDE FLUSH 0.9 % IV SOLN
10.0000 mL | Freq: Once | INTRAVENOUS | Status: DC
Start: 2020-05-02 — End: 2020-05-03

## 2020-05-02 MED ORDER — INSULIN LISPRO (HUMAN) 100 UNIT/ML SC SOLN (~~LOC~~)
4.0000 [IU] | Freq: Three times a day (TID) | SUBCUTANEOUS | Status: DC
Start: 2020-05-02 — End: 2020-05-04
  Administered 2020-05-03 – 2020-05-04 (×5): 4 [IU] via SUBCUTANEOUS

## 2020-05-02 MED ORDER — FUROSEMIDE 10 MG/ML IJ SOLN
20.0000 mg | Freq: Once | INTRAMUSCULAR | Status: AC
Start: 2020-05-02 — End: 2020-05-02
  Administered 2020-05-02 (×2): 20 mg via INTRAVENOUS
  Filled 2020-05-02: qty 2

## 2020-05-02 MED ORDER — CALCIUM GLUCONATE 10 % IV SOLN
2000.0000 mg | Freq: Once | INTRAVENOUS | Status: AC
Start: 2020-05-02 — End: 2020-05-02
  Administered 2020-05-02 (×2): 2000 mg via INTRAVENOUS
  Filled 2020-05-02: qty 20

## 2020-05-02 MED ORDER — DEXTROSE 50 % IV SOLN
25.0000 mL | INTRAVENOUS | Status: DC | PRN
Start: 2020-05-02 — End: 2020-05-04

## 2020-05-02 MED ORDER — DEXTROSE 50 % IV SOLN
12.5000 mL | INTRAVENOUS | Status: DC | PRN
Start: 2020-05-02 — End: 2020-05-04

## 2020-05-02 MED ORDER — DEXTROSE 50 % IV SOLN
100.0000 mL | Freq: Once | INTRAVENOUS | Status: DC
Start: 2020-05-02 — End: 2020-05-03
  Filled 2020-05-02: qty 100

## 2020-05-02 MED ORDER — ACETAMINOPHEN 650 MG RE SUPP
650.0000 mg | RECTAL | Status: DC | PRN
Start: 2020-05-02 — End: 2020-05-04

## 2020-05-02 NOTE — ED Provider Notes (Addendum)
CHIEF COMPLAINT  Dizziness (dizziness, unable to stand due to dizziness, uses walker, scheduled to have endoscopy tomorrow, diarrhea x2 weeks denies any blood in diarrhea. per daughter not covid vaccinated)      HISTORY OF PRESENT ILLNESS:   Carrie Knight is a 79 year old female with a history of diabetes, hypertension, and recently diagnosed anemia who presents with 2 weeks of watery nonbloody diarrhea and 1 day of dizziness with inability to stand/walk. Patient accompanied by her daughter today. They report that she has a long history of chronic diarrhea that recently became more frequent and watery over the past 2 weeks. Reports diarrhea is watery/yellow, denies blood in stool. Also endorses dizziness starting today that is so severe that she has not been able to stand or walk. Endorses a spinning sensation on standing. +nausea with small volume clear emesis x1. Poor PO intake over past 2 weeks but yesterday ate 2 taquitos and melon.       Saw PCP in August who ordered labs, she was told she has anemia, uncontrolled diabetes, and abnormal renal labs. Has endoscopy and colonoscopy scheduled tomorrow at OSH for further evaluation of anemia. Denies recent antibiotic use or history of c. Diff. 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 Has a history of recurrent UTIs but denies current urinary symptoms or changes to urine quantity or color. Denies F/C, chest pain, SOB, palpitations.     REVIEW OF SYSTEMS:  Constitutional: No fever  Head: No headache  CV: No chest pain  Resp: No shortness of breath  GI: No vomiting    All other systems reviewed and negative except as noted above    PAST MEDICAL HISTORY:  Past Medical History:   Diagnosis Date    Diabetes mellitus (CMS-HCC)     Heart palpitations     Hypertension       Patient Active Problem List    Diagnosis Date Noted    Gastroenteritis 05/03/2020     Atrial fibrillation with RVR (CMS-HCC) 05/03/2020    Hyperkalemia 05/03/2020    Type 2 diabetes mellitus with kidney complication, with long-term current use of insulin (CMS-HCC) 05/03/2020     Per patient, diabetes, hypertension, and anemia. Was told by a doctor she has a cardiac problem but doesn't know more. Unsure why alcohol withdrawal is mentioned in problem list as above as patient denies ever using alcohol      SURGICAL HISTORY:  Past Surgical History:   Procedure Laterality Date    BLADDER SURGERY      HYSTERECTOMY        ALLERGIES:  No Known Allergies      CURRENT MEDICATIONS:   Please see nursing notes    Per patient, takes iron for anemia, metformin 1000 mg BID, lisinopril (1 pill daily, dose unknown), insulin (32 U every morning)    FAMILY HISTORY:  Reviewed and considered non-contributory      SOCIAL HISTORY:  Tobacco: None  Alcohol: None  Drug use: None      VITAL SIGNS:  First Vitals [05/02/20 0937]   Temperature Heart Rate Respirations Blood pressure (BP) SpO2   97.5 F (36.4 C) 72 16 (!) 145/95 100 %       PHYSICAL EXAM:  General: Awake, Alert, appears to be in no apparent distress   Head: Normocephalic, atraumatic   Eyes: No scleral icterus, no conjunctival injection   ENT: Normal appearing  ears externally, normal appearing nose externally   Neck: Supple, no tracheal deviation   Respiratory: Normal respiratory effort, no audible stridor, CTAB  Cardiovascular: Irregular rate, tachycardia, warm and well perfused       Abdomen: Soft and nontender  Back: No CVA tenderness  Skin: No jaundice, no rash   Extremities: No edema   Neuro: Awake, alert, moving all extremities. 5/5 strength in all fields. No dysmetria with finger to nose. Able to stand but is slightly unsteady. CN2-12 intact. No nystagmus     MEDICAL DECISION MAKING:  Carrie Knight is a 79 year old female who presents with 2 weeks watery diarrhea, 1 day severe dizziness, found to have afib on initial triage EKG. Vitals remarkable for  tachycardia. No previous history of heart conditions or Afib. POCUS showing appropriate EF and plethoric IVC. Exam without clinical signs of overload (JVD, lower extremity edema, CTAB). Will obtain broad labs, imaging, control rate, and resuscitate. Differential considers Afib with RVR, ACS, elevated troponin, no systemic symptoms to indicate sepsis or severe infectious diarrhea, no evidence of fluid overload to suggest new heart failure, doubt UTI given recent change to foley and no change to output.     I have reviewed the patient's labs which were notable for K 6.7. I have also reviewed prior records which were summarized in my HPI.    Critical Care Note:  Upon my evaluation, this patient had a high probability of imminent or life-threatening deterioration due to hyperkalemia and a fib, which required my direct attention, intervention, and personal management.     I have personally provided 32 minutes of critical care time exclusive of time spent on separately billable procedures. Time includes review of laboratory data, radiology results, consultation with specialists, and monitoring for potential decompensation. Interventions were performed as documented in the medical record.     ED COURSE:  Workup Summary         Value   Comment By Time       Admitted: 79 year old female admitted to medicine service for afib, diarrhea.<br>-level of care: tele<br><br> Sheran Fava, MD 09/19 1316       Cards paged Sheran Fava, MD 09/19 1204    Lactic Acid: 0.9 <br>  (Reviewed) Sheran Fava, MD 09/19 1138      Hgb: 10.9 <br>  (Reviewed) Sheran Fava, MD 09/19 1118       Has hx of DM, HTN, anemia Sheran Fava, MD 09/19 1057       Given metoprolol given afib with RVR Sheran Fava, MD 09/19 1057       No change to the output into her foley bag (character, quality, volume) Sheran Fava, MD 09/19 1010       Foley just changed last Nedra Hai, MD 09/19 1009    Heart Rate: 72  <br>  (Reviewed) Sheran Fava, MD 09/19 0957      Blood pressure (BP): 145/95 <br>  (Reviewed) Sheran Fava, MD 09/19 0957       2w diarrhea. Today with dizziness. Has colonoscopy/EGD scheduled for tomorrow that was unrelated to symptoms but for previously seen anemia on outpatient labs as well as hematuria. Thinks that she is taking antibiotics for the diarrhea but is unsure which kind (for the past 3 days) Sheran Fava, MD 09/19 [phone]               DIAGNOSIS:    ICD-10-CM ICD-9-CM   1. Atrial fibrillation with rapid ventricular response (CMS-HCC)  I48.91  427.31   2. Dizziness  R42 780.4   3. Diarrhea, unspecified type  R19.7 787.91   4. Hypertension, unspecified type  I10 401.9   5. Anemia, unspecified type  D64.9 285.9   6. Decreased functional mobility  R26.89 781.99   7. Hyperkalemia  E87.5 276.7            Sheran Fava, MD  Resident  05/02/20 1819    Attending Attestation: I was present with the resident during the history and exam.  I discussed the case with the resident and agree with the findings and plan as documented by the resident.  My additions or revisions are included in the record.    ATTENDING ATTESTATION:  I was physically present with the Medical Student during the performance of the history and examination and we jointly participated in medical decision-making.  I reviewed and agree with the findings and plan as documented by the Medical Student.  My additions and revisions are included in the record as necessary.    Emeline Gins, MD  05/08/20 7:53 PM       Emeline Gins, MD  05/03/20 11:05 AM        Dene Gentry, Patience Musca, MD  05/03/20 1106       Algaze-Gonzalez, Patience Musca, MD  05/08/20 [phone]

## 2020-05-02 NOTE — Plan of Care (Signed)
Problem: Promotion of health and safety  Goal: Promotion of Health and Safety  Description: The patient remains safe, receives appropriate treatment and achieves optimal outcomes (physically, psychosocially, and spiritually) within the limitations of the disease process by discharge.  Outcome: Progressing  Flowsheets (Taken 05/02/2020 1751)  Standard of Care/Policy:   Falls Reduction   Skin   Diabetes   Telemetry  Outcome Evaluation (rationale for progressing/not progressing) every shift: Patient A&Ox4,denied pain,c/o of gen weakness.no Sob.Diet tolerated,fair appetite .No Bm today.F/c with adequate amount of clear yellow urine  Individualized Interventions/Recommendations (Discharge Readiness): No plan d/c yet  Individualized Interventions/Recommendations (Skin/Comfort/Safety/Mobility): Encourage OOB for meals .Turn self in bed.Call for help when OOB  Individualized Interventions/Recommendations (Knowledge): Reinforce education on oral intake and fall prevention  Individualized Interventions/Recommendations: Manage BG.Marland KitchenAnticoaguant.Monitor BG.  Individualized Interventions/Recommendations: Monitor BMP .

## 2020-05-02 NOTE — ED EKG Interpretation (Signed)
ED EKG Interpretation    EKG: Sinus Tachycardia 144 with Normal Axis and no ischemic changes.

## 2020-05-02 NOTE — H&P (Signed)
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 She has a spinning sensation with standing. She has had chronic diarrhea for years from the side effect of metformin that has recently been worsening over the past 2 weeks. She has been having watery and yellow diarrhea upto 6 times during the day and 4 times at nighttime. Per daughter at bedside, any time she drank/ate including water and fruits, she would have diarrhea immediately. She received oral antibiotics and anti-diarrheal medication at an outside clinic and has taken it for two days with some improvement. She has been having poor PO intake. Reports nausea and vomiting. She had some palpitations during afib with RVR. Denies fever, chills, chest pain, SOB, blood in stool, or change in baseline urinary symptoms. No recent travel, sick contacts, recent prolonged use of antibiotics, or history of C. Diff.     Of note, patient states that she saw her PCP in August who ordered labs and told her than she has anemia, uncontrolled diabetes, and abnormal renal labs. She was supposed to have an endoscopy and colonoscopy tomorrow at an OSH for further evaluation of anemia. She never had any endoscopy done in the past. She also has a history of urinary retention and incontinence on chronic foley catheter, which is changed once month. It was recently changed 4 days ago but patient reported discomfort at the foley insertion site.     In the ED:   - Afib with RVR to 130s and received metop push IVP 5  mg x1 with improvement    - Bedside cardiac ultrasound without abnormalities and with plump IVC   - Lasix, insulin + D50, and calcium gluconate given   - Zofran and Loperamide were also given    [employer] Interpreter [phone] used.    Past Medical/Surgical History:  Past Medical History:   Diagnosis Date    Diabetes mellitus (CMS-HCC)     Heart palpitations     Hypertension        Family History:  Non-contributory    Social History:  Social History     Tobacco Use    Smoking status: Never Smoker    Smokeless tobacco: Never Used   Substance Use Topics    Alcohol use: Not Currently    Drug use: Never       Allergies:  No Known Allergies    Medications:  HOME MEDS:  (Not in a hospital admission)    SCHEDULED MEDS:   aspirin  81 mg Daily    dextrose  100 mL Once    heparin  5,000 Units Q8H    insulin glargine  12 Units HS    insulin lispro  1-6 Units HS    insulin lispro  2-12 Units TID AC    insulin lispro  4 Units TID AC    magnesium sulfate  4 g Once    metoprolol tartrate  25 mg BID    sodium  chloride  10 mL Once     PRN MEDS:   acetaminophen  650 mg Q4H PRN    Or    acetaminophen  650 mg Q4H PRN    dextrose 10%  50 mL/hr Continuous PRN    dextrose  25 mL Q15 Min PRN    dextrose  50 mL Q15 Min PRN    dextrose  12.5 mL Q15 Min PRN    glucagon HCl (Diagnostic)  1 mg Q15 Min PRN    lidocaine  5 mL Once PRN    ondansetron  4 mg Q6H PRN    Or    ondansetron  4 mg Q6H PRN    perflutren lipid microspheres  0.4 mL PRN    sodium chloride  10 mL PRN    sodium chloride  10 mL PRN       Review of Systems (positives in bold):   Constitutional: fever, chills, weight loss, weight gain  Head & Neck: headache, vision change, hearing change, rhinorrhea, sore throat  Respiratory: shortness of breath, cough, sputum, hemoptysis  Cardiovascular: chest pain/pressure, orthopnea, PND, palpitations  Gastrointestinal: abdominal pain, nausea, vomiting, diarrhea, melena, rectal bleeding  Genitourinary: dysuria, polyuria, hesitancy,  frequency  Musculoskeletal: joint pain, leg edema  Neurological: focal numbness, weakness, seizure    All other Review of Systems is negative.          Physical Exam:  Vital Signs:  BP 136/63    Pulse 96    Temp 97.5 F (36.4 C)    Resp 18    Ht 4' 9"  (1.448 m)    Wt 65.8 kg (145 lb)    SpO2 100%    BMI 31.38 kg/m     General:  female NAD  HEENT: PERRL, EOMI, clear oropharynx  Neck: supple, normal cartoid pulses, no bruits, no JVD  Chest/Breast: symmetric, no masses, tenderness, discharge, or lymphadenopathy   Lungs: clear (B), symmetric chest expansion  Heart: irregularly irregular, no murmurs   Abdomen: soft, non-tender, normal bowel sounds, no HSM, no bruits  GU/Rectal:  Foley catheter placement with bag filled with urine   Extremities: normal pulses, no edema  Neurological: CN2-12 intact, motor and sensory exams grossly intact, alert and oriented x 4  Lymphatic:  no lymphadenopathy   Skin: no skin rashes      Diagnostic Data:  Nursing Notes Reviewed:  No intake or output data in the 24 hours ending 05/02/20 1311    No results for input(s): GLUPOC in the last 72 hours.     RASS Score: Alert and calm    Laboratory Data:  Lab Results   Component Value Date    WBCCOUNT 7.2 05/02/2020    HGB 10.9 (L) 05/02/2020    HCT 32.4 (L) 05/02/2020    PLT 290 05/02/2020    RBC 3.66 (L) 05/02/2020    MCV 88.5 05/02/2020    MCH 29.7 05/02/2020    MCHC 33.6 05/02/2020    MPVH 7.4 05/02/2020    RDW 15.1 (H) 05/02/2020    NEUTP 73.1 05/02/2020    NEUTAB 5.2 05/02/2020    LYMPP 17.2 05/02/2020    LYMAB2 1.2 05/02/2020    MONOSP 7.2 05/02/2020    MONOAB 0.5 05/02/2020    EOSNP 2.0 05/02/2020    EOSAB 0.1 05/02/2020    BASOP 0.5 05/02/2020    BASOAB 0.0 05/02/2020       Lab Results   Component Value Date/Time    SODIUM 133 (L) 05/02/2020 10:41 AM  K 6.7 (HH) 05/02/2020 10:41 AM    CL 110 (H) 05/02/2020 10:41 AM    CO2 17 (L) 05/02/2020 10:41 AM    BUN 24 05/02/2020 10:41 AM    CREAT 1.1 05/02/2020 10:41 AM    GLU 103 05/02/2020  10:41 AM    [place] 9.4 05/02/2020 10:41 AM    MG 1.5 (L) 05/02/2020 10:41 AM    PHOS 3.9 05/02/2020 10:41 AM    TPROT 7.1 05/02/2020 10:41 AM    ALB 4.1 05/02/2020 10:41 AM    ALK 91 05/02/2020 10:41 AM    TBILI 0.4 05/02/2020 10:41 AM    AST 28 05/02/2020 10:41 AM    ALT 22 05/02/2020 10:41 AM    LACT 0.9 05/02/2020 10:41 AM       Lab Results   Component Value Date/Time    PROTIME 13.4 12/05/2018 03:28 PM    INR 1.02 12/05/2018 03:28 PM    PTT 30.7 12/05/2018 03:28 PM       Other Data:  EKG: atrial fibrillation with RVR at 140s       Impression:  This is a 79 year old female presenting with dizzines and diarrhea who was found to have atrial fibrillation with RVR To 140s who is being admitted for further evaluation and management.    Assessment:  # Dizziness: Possibly from orthostatic hypotension from multiple episodes of diarrhea vs cardiogenic from new onset atrial fibrillation with RVR. However, bedside US with plump IVC so IVF was not given. Less likely related to neurogenic etiology as her neuro exam is unremarkable without any focal deficits.  # Acute on chronic diarrhea: Possibly from viral gastroenteritis.   # Atrial fibrillation with RVR (CHADSVASC 5), likely paroxysmal: Sustained heart rate to 130s with improvement after one push of metoprolol 5 mg x1. Cardiology consulted in the ED, patient now on metoprolol tartrate. Not on AC at home.   # New-onset sustained atrial fibrillation: Currently rate controlled. Please see problem above.  # Hyperkalemia: Received lasix, insulin, and calcium gluconate in the ED. Creatinine normal on admission. Per patient, she had abnormal renal labs previously but no known baseline creatinine. Repeat BMP pending.   # Elevated troponin: Likely in the setting of afib with RVR. Peaked at 25 ad resolved.    ---Chronic Issues---  # DM2: On home metformin and Lnatus 32U QAM  # HTN: On home lisinopril and coreg   # Normocytic anemia: Currently under work-up as outpatient    Plan:  -  Admit to telemetry   - Obtain orthostatic vital signs  - Check on repeat BMP   - Metroprolol tartrate 25 mg BID  - Weight-based insulin, SSI  - Hold on home antihypertensives (Coreg and Lisinopril)  - Stool culture and O&P  - C. Diff  - Cystatin C  - TTE  - Discuss starting AC with family after TTE results      Nutrition:  Diet Order Therapeutic; Cardiac, Consistent Carb (Diabetic); Mod 180-225gm (1600-1900 cal)   VTE Risk/Prevention: moderate, heparin ppx  Anticipated Disposition: Home  Code Status: Full Code          Barbaraann Rondo, MD   Internal Medicine PGY-3          **PRELIMINARY** NEEDS TO BE STAFFED WITH ATTENDING**       ATTENDING ATTESTATION:    Date of Attending Evaluation:  05/02/2020

## 2020-05-02 NOTE — ED Procedure Note (Signed)
Procedure Note  Procedures    Cardiac IVC Ultrasound  Indication:  Fluid responsiveness evaluation  Findings:  Exam suggests fluid status is elevated .    Procedure:  Using the phased array probe, a subcostal view long of the IVC was obtained and was evaluated for respiratory variation at 1 cm distal to the hepatic vein.  Minimal variation with inspiration.  Video clips were recorded for archival purposes.  The images were reviewed by the ED attending physician.    Cardiac Ultrasound  Indication:  chest pain  Findings:  No evidence of pericardial effusion  Procedure:  Using the phased array probe, a subcostal view of the heart was obtained and demonstrated no evidence of a pericardial effusion.  Next, a parasternal-long window was obtained and also showed no evidence of a pericardial effusion.  Video clips were recorded for archival purposes.  The images were reviewed by the ED attending physician.

## 2020-05-02 NOTE — ED Notes (Signed)
ER MD notified of Elev K+

## 2020-05-02 NOTE — ED Notes (Signed)
Bed: 01  Expected date:   Expected time:   Means of arrival:   Comments:  [place]

## 2020-05-03 ENCOUNTER — Other Ambulatory Visit: Payer: PRIVATE HEALTH INSURANCE

## 2020-05-03 DIAGNOSIS — I4891 Unspecified atrial fibrillation: Secondary | ICD-10-CM

## 2020-05-03 DIAGNOSIS — I340 Nonrheumatic mitral (valve) insufficiency: Secondary | ICD-10-CM

## 2020-05-03 DIAGNOSIS — K529 Noninfective gastroenteritis and colitis, unspecified: Secondary | ICD-10-CM | POA: Diagnosis present

## 2020-05-03 DIAGNOSIS — E1129 Type 2 diabetes mellitus with other diabetic kidney complication: Secondary | ICD-10-CM | POA: Diagnosis present

## 2020-05-03 DIAGNOSIS — I10 Essential (primary) hypertension: Secondary | ICD-10-CM | POA: Diagnosis present

## 2020-05-03 DIAGNOSIS — E1122 Type 2 diabetes mellitus with diabetic chronic kidney disease: Secondary | ICD-10-CM | POA: Diagnosis present

## 2020-05-03 DIAGNOSIS — D649 Anemia, unspecified: Secondary | ICD-10-CM | POA: Diagnosis present

## 2020-05-03 DIAGNOSIS — E875 Hyperkalemia: Secondary | ICD-10-CM | POA: Diagnosis present

## 2020-05-03 LAB — COMPLETE 2D ECHO WITH 3D WITH IMAGE ENHANCEMENT AGENT IF NECESSARY
AO Mean Gradient: 3 mmHg
AO Peak Velocity: 1.25 m/s
Aortic Valve Area: 2.33 cm2
LA Volume Index: 51.6 ml/m2
LV Diastolic Diameter: 4.2 cm
LV Ejection Fraction: 68.2 %
LV Stroke Volume Index: 30.6 ml/m2
Mitral Valve Area: 4.49 cm2
TR Velocity: 2.41 m/s

## 2020-05-03 LAB — LIVER PANEL, BLOOD
ALT: 28 U/L (ref 7–52)
AST: 23 U/L (ref 13–39)
Albumin: 3.8 G/DL (ref 3.7–5.3)
Alk Phos: 91 U/L (ref 34–104)
Bilirubin, Direct: 0.1 mg/dL (ref 0.0–0.2)
Bilirubin, Total: 0.3 mg/dL (ref 0.0–1.4)
Protein, Total: 6 G/DL (ref 6.0–8.3)

## 2020-05-03 LAB — MAGNESIUM, BLOOD: Magnesium: 1.9 mg/dL (ref 1.9–2.7)

## 2020-05-03 LAB — CBC WITH DIFF, BLOOD
ANC automated: 3.1 10*3/uL (ref 2.0–8.1)
Basophils %: 0.5 %
Basophils Absolute: 0 10*3/uL (ref 0.0–0.2)
Eosinophils %: 3.2 %
Eosinophils Absolute: 0.2 10*3/uL (ref 0.0–0.5)
Hematocrit: 29.3 % — ABNORMAL LOW (ref 34.0–44.0)
Hgb: 9.8 G/DL — ABNORMAL LOW (ref 11.5–15.0)
Lymphocytes %: 30.6 %
Lymphocytes Absolute: 1.7 10*3/uL (ref 0.9–3.3)
MCH: 29.5 PG (ref 27.0–33.5)
MCHC: 33.5 G/DL (ref 32.0–35.5)
MCV: 88.1 FL (ref 81.5–97.0)
MPV: 7.6 FL (ref 7.2–11.7)
Monocytes %: 9.6 %
Monocytes Absolute: 0.5 10*3/uL (ref 0.0–0.8)
Neutrophils % (A): 56.1 %
PLT Count: 258 10*3/uL (ref 150–400)
RBC: 3.32 10*6/uL — ABNORMAL LOW (ref 3.70–5.00)
RDW-CV: 15.1 % — ABNORMAL HIGH (ref 11.6–14.4)
White Bld Cell Count: 5.6 10*3/uL (ref 4.0–10.5)

## 2020-05-03 LAB — BASIC METABOLIC PANEL, BLOOD
BUN: 27 mg/dL — ABNORMAL HIGH (ref 7–25)
BUN: 29 mg/dL — ABNORMAL HIGH (ref 7–25)
CO2: 21 mmol/L (ref 21–31)
CO2: 19 mmol/L — ABNORMAL LOW (ref 21–31)
Calcium: 9.2 mg/dL (ref 8.6–10.3)
Calcium: 9.6 mg/dL (ref 8.6–10.3)
Chloride: 109 mmol/L — ABNORMAL HIGH (ref 98–107)
Chloride: 109 mmol/L — ABNORMAL HIGH (ref 98–107)
Creat: 1.2 mg/dL (ref 0.6–1.2)
Creat: 1.4 mg/dL — ABNORMAL HIGH (ref 0.6–1.2)
Electrolyte Balance: 6 mmol/L (ref 2–12)
Electrolyte Balance: 8 mmol/L (ref 2–12)
Glucose: 116 mg/dL (ref 85–125)
Glucose: 150 mg/dL — ABNORMAL HIGH (ref 85–125)
Potassium: 5.9 mmol/L — ABNORMAL HIGH (ref 3.5–5.1)
Potassium: 5.1 mmol/L (ref 3.5–5.1)
Sodium: 136 mmol/L (ref 136–145)
Sodium: 136 mmol/L (ref 136–145)
eGFR - high estimate: 53 — ABNORMAL LOW (ref >59)
eGFR - high estimate: 45 — ABNORMAL LOW (ref >59)
eGFR - low estimate: 44 — ABNORMAL LOW (ref >59)
eGFR - low estimate: 37 — ABNORMAL LOW (ref >59)

## 2020-05-03 LAB — GLUCOSE, POINT OF CARE
Glucose, Point of Care: 112 MG/DL (ref 70–125)
Glucose, Point of Care: 106 MG/DL (ref 70–125)
Glucose, Point of Care: 209 MG/DL — ABNORMAL HIGH (ref 70–125)
Glucose, Point of Care: 230 MG/DL — ABNORMAL HIGH (ref 70–125)
Glucose, Point of Care: 104 MG/DL (ref 70–125)
Glucose, Point of Care: 101 MG/DL (ref 70–125)
Glucose, Point of Care: 109 MG/DL (ref 70–125)

## 2020-05-03 LAB — PHOSPHORUS, BLOOD: Phosphorus: 4.8 MG/DL (ref 2.5–5.0)

## 2020-05-03 LAB — MRSA CULTURE
Culture Result: NEGATIVE
Culture Result: NEGATIVE

## 2020-05-03 LAB — CYSTATIN C WITH ESTIMATED GFR
Cystatin C: 1.84 mg/L — ABNORMAL HIGH (ref 0.50–1.00)
Estimated GFR Cystatin C: 29 mL/min/sq m BSA — ABNORMAL LOW (ref >60)

## 2020-05-03 MED ORDER — LIDOCAINE 4 % EX PTCH
1.0000 | MEDICATED_PATCH | CUTANEOUS | 0 refills | Status: DC
Start: 2020-05-03 — End: 2020-05-06

## 2020-05-03 MED ORDER — INSULIN GLARGINE 100 UNIT/ML SC SOLN
34.0000 [IU] | Freq: Every day | SUBCUTANEOUS | 5 refills | Status: AC
Start: 2020-05-03 — End: ?

## 2020-05-03 MED ORDER — METFORMIN HCL 1000 MG OR TABS
1000.0000 mg | ORAL_TABLET | Freq: Two times a day (BID) | ORAL | 3 refills | Status: AC
Start: 2020-05-03 — End: ?

## 2020-05-03 MED ORDER — DEXTROSE 50 % IV SOLN
50.0000 mL | Freq: Once | INTRAVENOUS | Status: AC
Start: 2020-05-03 — End: 2020-05-03
  Administered 2020-05-03 (×2): 25 g via INTRAVENOUS
  Filled 2020-05-03: qty 50

## 2020-05-03 MED ORDER — SODIUM POLYSTYRENE SULFONATE 15 GM/60ML WITH SORBITOL (~~LOC~~)
30.0000 g | Freq: Every day | ORAL | 0 refills | Status: DC
Start: 2020-05-04 — End: 2020-05-09
  Filled 2020-05-03: qty 240, 2d supply, fill #0

## 2020-05-03 MED ORDER — INSULIN LISPRO (HUMAN) 100 UNIT/ML SC SOLN (~~LOC~~)
10.0000 [IU] | Freq: Once | SUBCUTANEOUS | Status: AC
Start: 2020-05-03 — End: 2020-05-03
  Administered 2020-05-03: 10 [IU] via INTRAVENOUS

## 2020-05-03 MED ORDER — METOPROLOL TARTRATE 25 MG OR TABS
25.0000 mg | ORAL_TABLET | Freq: Two times a day (BID) | ORAL | 0 refills | Status: DC
Start: 2020-05-03 — End: 2020-05-09
  Filled 2020-05-03: qty 180, 90d supply, fill #0

## 2020-05-03 MED ORDER — SODIUM CHLORIDE 0.9 % IV BOLUS (~~LOC~~)
500.0000 mL | INJECTION | Freq: Once | INTRAVENOUS | Status: AC
Start: 2020-05-03 — End: 2020-05-03
  Administered 2020-05-03 (×2): 500 mL via INTRAVENOUS

## 2020-05-03 MED ORDER — SODIUM POLYSTYRENE SULFONATE 15 GM/60ML WITH SORBITOL (~~LOC~~)
30.0000 g | Freq: Once | ORAL | Status: AC
Start: 2020-05-03 — End: 2020-05-03
  Administered 2020-05-03 (×2): 30 g via ORAL
  Filled 2020-05-03: qty 120

## 2020-05-03 MED ORDER — SODIUM CHLORIDE 0.9 % IV BOLUS (~~LOC~~)
500.0000 mL | INJECTION | Freq: Once | INTRAVENOUS | Status: AC
Start: 2020-05-03 — End: 2020-05-03
  Administered 2020-05-03 (×2): 500 mL via INTRAVENOUS

## 2020-05-03 MED ORDER — SODIUM CHLORIDE 0.9 % IV SOLN
1.0000 g | Freq: Once | INTRAVENOUS | Status: AC
Start: 2020-05-03 — End: 2020-05-03
  Administered 2020-05-03: 1 g via INTRAVENOUS
  Filled 2020-05-03: qty 2

## 2020-05-03 NOTE — Plan of Care (Signed)
Problem: Promotion of health and safety  Goal: Promotion of Health and Safety  Description: The patient remains safe, receives appropriate treatment and achieves optimal outcomes (physically, psychosocially, and spiritually) within the limitations of the disease process by discharge.  Outcome: Progressing  Flowsheets (Taken 05/03/2020 0532)  Standard of Care/Policy:   Telemetry   Falls Reduction   Diabetes  Outcome Evaluation (rationale for progressing/not progressing) every shift: Pt asleep, easily arousable. No diarrhea this shift. Able to transfer to and from bed and bedside commode with standby assistance, no c/o dizziness/weakness. Tolerating room air.  Patient /Family stated Goal: To eat better for diarrhea to improve.  Individualized Interventions/Recommendations (Discharge Readiness): Discharge not yet appropriate, for stool studies  Individualized Interventions/Recommendations (Skin/Comfort/Safety/Mobility): Keep call light within reach and bed alarm on. Check pt frequently, assist with ADLs and when out of bed.  Individualized Interventions/Recommendations: Mining engineer. Monitor for  Individualized Interventions/Recommendations: Monitor for diarrhea. Collect sample cultures/O&P/C diff.

## 2020-05-03 NOTE — Interdisciplinary (Signed)
Care Management Assessment, Adult       Initial Assessment  CM Initial Assessment *: Completed    Patient Information  Where was the patient admitted from? *: Home  Prior to Level of Function *: Ambulatory/Independent with ADL's  Assistive Device *: Environmental consultant  Prior [employer] Resources: None  Dentist) *: Self  Primary Contact Name, Number and Relationship *: Adore Kithcart, daughter [phone]  Permission to Contact *: Yes  Secondary Contact Name, Number and Relationship: Bernetta Sutley, son [phone]           Discharge Planning  Living Arrangements *: Family Member  Available Assistance/Support System *: Family member(s)  Type of Residence *: [place] *: No  Anticipated Discharge Dispostion/Needs: Home  Patient's Discharge Goal(s): Home  Barriers to Discharge *: Clinical reason  Do you have difficulty affording your medications: No  Patient/Family/Other Engaged in Discharge Planning *: Yes  Name, Relationship and Phone Number of Person Engaged in the Discharge Plan: Lucy [place] Chandelle Harkey [phone]  Patient Has Decision Making Capacity *: Yes  Patient/Family/Legal/Surrogate Decision Maker Has Been Given a List Options And Choice In The Selection of Post-Acute Care Providers *: Not Applicable  Family/Caregiver's Assessed for *: Readiness, willingness, and ability to provide or support self-management activities  Respite Care *: Not Applicable  Patient/Family/Other Are In Agreement With Discharge Plan *: Yes  Public Health Clearance Needed *: Not Applicable    Social Worker Consult  Do you need to see a social worker? 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    Attempted to meet with pt at bedside, but no interpeter available and had to use bathroom a couple of times. Contacted daughter, Milliani Herrada [phone], who verified demographics. Pt lives with Michelene Heady in a mobile home with 4 steps. Pt was independent at baseline with ADLs and uses a walker for Ambulation. No HH or Adv Dir. Decision Maker would be dtr Liller Yohn [phone] and will also provide transportation.    DCP - Home with family. No needs at this time.    CM will follow for DCP needs.

## 2020-05-03 NOTE — Interdisciplinary (Signed)
Occupational Therapy Evaluation and Discharge    Admitting Physician:  Eather Colas, *  Admission Date 05/02/2020    Inpatient Diagnosis:   Problem List       Codes    Atrial fibrillation with rapid ventricular response (CMS-HCC)    -  Primary ICD-10-CM: I48.91  ICD-9-CM: 427.31    Relevant Medications    metoprolol tartrate (LOPRESSOR) 25 MG tablet    Dizziness     ICD-10-CM: R42  ICD-9-CM: 780.4    Diarrhea, unspecified type     ICD-10-CM: R19.7  ICD-9-CM: 787.91    Hypertension, unspecified type     ICD-10-CM: I10  ICD-9-CM: 401.9    Anemia, unspecified type     ICD-10-CM: D64.9  ICD-9-CM: 285.9    Decreased functional mobility     ICD-10-CM: R26.89  ICD-9-CM: 781.99          IP Start of Service  Start of Care: 05/03/20  Onset Date: 05/02/2020  Reason for referral: Decline in performance of activities of daily living (ADL)    Preferred [place]  Interpreter Services-Rehab: Telephone  Interpreter Name or ID telephone: Alyson Locket    Past Medical History:   Diagnosis Date    Diabetes mellitus (CMS-HCC)     Heart palpitations     Hypertension       Past Surgical History:   Procedure Laterality Date    BLADDER SURGERY      HYSTERECTOMY         OT Acute     Row Name 05/03/20 1100          Type of Visit    Type of Occupational Therapy note  Occupational Therapy Evaluation and Discharge     [place] Name 05/03/20 1100          Treatment Time    Treatment Start Time  0830     Total TIMED Treatment (min)  30     Total Treatment Time (min)  4     Row Name 05/03/20 1100          Treatment Precautions/Restrictions    Precautions/Restrictions  Fall     Fall  Socks/charm     Other Precautions/Restrictions Information  foley catheter     Row Name 05/03/20 1100          Medical History    History of presenting condition  Per chart, "This is a 79 year old female presenting with dizzines and diarrhea who was found to have atrial fibrillation with RVR To 140s who is being admitted for further  evaluation and management."     Fall history  Falls reported in the last 6 months     [place] Name 05/03/20 1100          Functional History    Prior Level of Function  No deficits     General ADL/Self-Care Assistance Needs  Independent with ADLs and self care using adaptive device/equipment     Equipment required for mobility in the home  Walker;Cane     Other Functional History Information  Pt reports she typically does not use any AD, but occassionally uses SPC and FWW when she feels she needs extra stability.  She has a history of  ankle injury, many years ago.  Pt has fallen 3 times in past 6 months, twice tripping over curbs, and once in her home when the chair she was sitting on broke.      [place] Name 05/03/20 1100  Social History    Living Situation  Lives with family;Lives with spouse/partner     [place] accessibility  Performs activities of daily living (ADL's) on one level;Stairs present     Number of steps to enter home  4     [place]  Pt lives in a [facility] with 7 family members, wiht 24/7 support available if needed.      [place] Name 05/03/20 1100          Subjective    Patient status  Patient agreeable to treatment;Nursing in agreement for treatment     Row Name 05/03/20 1100          Pain Assessment    Pain Asssessment Tool  Numeric Pain Rating Scale     Row Name 05/03/20 1100          Numeric Pain Rating Scale    Pain Intensity - rating at present  0     Pain Intensity- rating after treatment  0     Row Name 05/03/20 1100          Activities of Daily Living (ADLs)    Self Feeding  Independent     Self Grooming  Independent     Upper Body Dressing  Supervised     Lower Body Dressing  Supervised     Bathing  Supervised     Toileting  Supervised     Toilet Transfers  Minimum assistance (25% assistance)     Other Toilet Transfers Information  [place] Name 05/03/20 1100          [place] AM-PAC: Daily Activity     Assistance Needed to Put on and Take off Regular Lower Body Clothing  3     Assistance Needed to Bathe, Including Washing, Rinsing, and Drying  3     Assistance Needed to Toilet Environmental manager, Bedpan, or Urinal)  3     Assistance Needed to Put on and Take off Regular Upper Body Clothing  3     Assistance Needed to Take Care of Personal Grooming Such as Brushing Teeth  4     Assistance Needed to Eat Meals  4     AM-PAC Daily Activity Total Score  20     AMP-PAC Daily Activity Impairment rating  Score 20-22 - 20-39% impaired     Row Name 05/03/20 1100          Objective    Overall Cognitive Status  Intact - no cognitive limitations or impairments noted     Communication  No communication limitations or impairments noted. Current status of hearing, speech and vision allow functional communication.     Coordination/Motor control  No limitations or impairments noted. Movement patterns are fluid and coordinated throughout     Balance  Balance limitations present     Static Sitting Balance  Normal - able to maintain steady balance without handhold support     Dynamic Sitting Balance  Good - accepts moderate challenge, able to maintain balance while picking object off floor     Static Standing Balance  Fair - able to maintain balance with handhold support, may require occasional minimal assistance     Dynamic Standing Balance  Fair - accepts minimal challenge, able to maintain balance while turning head/trunk     Other Balance Information  initially CGA, improving to SBA with time/warm-up, no  AD     Extremity Assessment  Flexibility, strength, muscle tone and sensation grossly within functional limits throughout     Functional Mobility  Functional mobility deficits present     Bed Mobility  Supervised     Transfers to/from Stand  Supervised     Ambulation during functional tasks  Minimum assistance (25% assistance);Supervised     Device used for ambulation/mobility  None     Ambulation Distance  household distances     Other  Objective Findings  Pt reported no dizziness throughout.  Orthostatics taken: supine BP 135/69, sitting EOB BP 144/61, standing BP 124/51, pt asymptomatic despite 20 point SBP drop in standing.  Pt performed LBD with Supervision sitting EOB, able to complete figure four technique.  Pt required CGA initially with sit to stand due to decreased balance, performing household distance functional mobility with notable improvement in balance, improving to Supervised level with no AD.  Pt left sitting up in bedside chair, educated on maintaining activity level with nursing Supervision during hospitalization to prevent deconditioning.           OT Acute Tool Box     Row Name 05/03/20 1100          Cardiopulmonary    Cardiopulmonary Comments  Asymptomatic throughout.  Orthostatics taken: supine BP 135/69, sitting EOB BP 144/61, standing BP 124/51, pt asymptomatic despite 20 point SBP drop in standing.     [place] Name 05/03/20 1100          Cognition Assessment    Overall Cognitive Status  Intact - no cognitive limitations or impairments noted             Eval cont.     [place] Name 05/03/20 1100          Assessment    Assessment  Pt presents to hospital with dizziness and diarrhea who was found to have atrial fibrillation with RVR To 140s who is being admitted for further evaluation and management.  She presents to OT performing her ADLs and functional mobility at her functional baseline, reporting no dizziness during evaluation.  She was noted with decreased balance due to reported history of L ankle injury, but her balance improved with functional mobility activity, to Supervised with no AD.  Pt reports that she has access to SPC/FWW, which she uses PRN when she feels she needs the added stability.  Will defer to PT for recommendation for AD.  Recommend shower chair for seated bathing, although pt reported she was not interested.  She has 24/7 family assistance as needed.  Pt agrees that she is at her functional baseline.  No  further acute OT services indicated at this time. Will d/c OT.      [place] Name 05/03/20 1100          Treatment Plan    Duration of treatment (number of visits)  One time only, further treatment not indicated     Status of treatment  One time only treatment, further skilled therapy not indicated     [place] Name 05/03/20 1100          Patient Safety Considerations    Patient safety considerations  Patient left sitting at end of treatment;Call light left in reach and fall precautions in place;Nursing notified of safety considerations at end of treatment     Patient assistive device requirements for safe ambulation  No device required     [place] Name 05/03/20 1100  Post Acute Discharge Recommendations    Discharge Rehabilitation Reccomendations [facility] ONLY)  None- patient currently  has no further skilled therapy needs     Equipment recommendations  [place] Name 05/03/20 1100          Therapy Plan Communication    Therapy Plan Communication  Discussed therapy plan with Nursing and/or Physician     [place] Name 05/03/20 1100          Occupational Therapy Patient Discharge Instructions    Your Occupational Therapist suggests the following  Continue to complete your self care Activities of Daily Living as frequently as possible     [place] Name 05/03/20 1100          Type of Eval    Low Complexity [phone])  Completed     [place] Name 05/03/20 1100          Therapeutic Procedures    Self-Care/ADL Training [phone])  Activities of daily living training;Patient education;Dressing;Safety procedures         Total TIMED Treatment (min)  15           The occupational therapist of record is endorsed by evaluating occupational therapist.

## 2020-05-03 NOTE — Plan of Care (Signed)
Problem: Promotion of health and safety  Goal: Promotion of Health and Safety  Description: The patient remains safe, receives appropriate treatment and achieves optimal outcomes (physically, psychosocially, and spiritually) within the limitations of the disease process by discharge.  Flowsheets  Taken 05/03/2020 1806 by Felizardo Hoffmann, RN  Outcome Evaluation (rationale for progressing/not progressing) every shift: Pt AOX4. No distress on shift. Hyperkalemmia protocol for K+ 5.9, recheck was 5.1. ECHO done. Ambulating to bathroom.  Individualized Interventions/Recommendations (Discharge Readiness): Monitor electrolytes overnnight, possible dc tomorrow.  Individualized Interventions/Recommendations (Knowledge): monitor K+  Individualized Interventions/Recommendations: monitor blood glucose  Taken 05/03/2020 0800 by Felizardo Hoffmann, RN  Patient /Family stated Goal: to find out plan  Taken 05/03/2020 0532 by Amada Kingfisher, RN  Standard of Care/Policy:   Telemetry   Falls Reduction   Diabetes  Individualized Interventions/Recommendations (Skin/Comfort/Safety/Mobility): Keep call light within reach and bed alarm on. Check pt frequently, assist with ADLs and when out of bed.  Note: Nursing Shift Summary  Shift Comments for the past 12 hrs:   Comments   05/03/20 0747 Resting in bed, no distress. AOX4, RA. Call light in reach.    05/03/20 1156 Team rounding at bedside   05/03/20 1200 family visiting at bedside, no distress. Pt in chair, call light in reach   05/03/20 1249 ECHO tech at bedside   05/03/20 1800 Resting in bed eating dinner. no distress. Notifeid that she will not go home today.       05/03/20  0450 05/03/20  0940 05/03/20  1227 05/03/20  1542   BP: 108/40 151/79 116/74 135/61   Pulse: 68 85 92 96   Resp: 17  18 18    Temp: 97.8 F (36.6 C) 97.2 F (36.2 C) 97.3 F (36.3 C) 97.3 F (36.3 C)   SpO2: 99% 98% 99% 99%

## 2020-05-03 NOTE — Interdisciplinary (Signed)
Physical Therapy Evaluation and Discharge    Admitting Physician:  Eather Colas, *  Admission Date 05/02/2020    Inpatient Diagnosis:   Problem List       Codes    Atrial fibrillation with rapid ventricular response (CMS-HCC)    -  Primary ICD-10-CM: I48.91  ICD-9-CM: 427.31    Dizziness     ICD-10-CM: R42  ICD-9-CM: 780.4    Diarrhea, unspecified type     ICD-10-CM: R19.7  ICD-9-CM: 787.91    Hypertension, unspecified type     ICD-10-CM: I10  ICD-9-CM: 401.9    Anemia, unspecified type     ICD-10-CM: D64.9  ICD-9-CM: 285.9    Decreased functional mobility     ICD-10-CM: R26.89  ICD-9-CM: 781.99          IP Start of Service   Start of Care: 05/03/20  Reason for referral: Decline in functional ability/mobility;Safety/judgement impairment    Preferred Language:Spanish    Engineer, site Services-Rehab: Telephone  Interpreter Name or ID telephone: [phone]    Past Medical History:   Diagnosis Date    Diabetes mellitus (CMS-HCC)     Heart palpitations     Hypertension       Past Surgical History:   Procedure Laterality Date    BLADDER SURGERY      HYSTERECTOMY         PT Acute     Row Name 05/03/20 0900          Type of Visit    Type of Physical Therapy note  Physical Therapy Evaluation and Discharge     Row Name 05/03/20 0900          Treatment Precautions/Restrictions    Precautions/Restrictions  None     Row Name 05/03/20 0900          Medical History    History of presenting condition  Per Chart: "This is a 79 year old female presenting with dizzines and diarrhea who was found to have atrial fibrillation with RVR To 140s who is being admitted for further evaluation and management."     Fall history  Falls reported in the last 6 months     Other Past Medical History Information  3 falls noted from tripping over items     Row Name 05/03/20 0900          Functional History    Prior Level of Function  No deficits     Equipment required for mobility in the home  None owns walker and cane;  uses as needed     Other Functional History Information  independent ambulator at baseline     Row Name 05/03/20 0900          Social History    Living Situation  Lives with parent/family;Has caregiver/attendant/assistance available     [place] accessibility   Performs activities of daily living (ADL's) on one level;Stairs present     Number of steps to enter home  4 Rail on [place] Name 05/03/20 0900          Subjective    Subjective Information  Pt received supine in bed, agreeable to therapy     Patient status  Patient agreeable to treatment;Nursing in agreement for treatment     [place] Name 05/03/20 0900          Pain Assessment    Pain Asssessment Tool  Numeric Pain  Rating Scale     Row Name 05/03/20 0900          Numeric Pain Rating Scale    Pain Intensity - rating at present  0     Pain Intensity- rating after treatment  0     Row Name 05/03/20 0900          Objective    Overall Cognitive Status  Intact - no cognitive limitations or impairments noted     Communication  No communication limitations or impairments noted. Current status of hearing, speech and vision allow functional communication.     Coordination/Motor control  No limitations or impairments noted. Movement patterns are fluid and coordinated throughout     Balance  Balance limitations present     Static Sitting Balance  Good - able to maintain balance without handhold support, limited postural sway     Dynamic Sitting Balance  Good - accepts moderate challenge, able to maintain balance while picking object off floor     Static Standing Balance  Good - able to maintain balance without handhold support, limited postural sway     Dynamic Standing Balance  Good - accepts moderate challenge, able to maintain balance while picking object off floor     Extremity Assessment  Flexibility, strength, muscle tone and sensation grossly within functional limits throughout     Functional Mobility  Functional mobility deficits present      Bed Mobility  Supervised     Bed Mobility Comments  Supervised for bed mobility. educated on sequencing to improve transition from supine to sit.     Transfers to/from [employer] Comments  Supervised for all functional transfers, demonstrates good positionings and eccentric lowering     Gait  Supervised     Gait Comments  Sup for ambulating x 300 ft withotu AD. Demonstrates mild wavering of gait initially but improves. good stability, cadence.     Device used for ambulation/mobility  None     Ambulation Distance  300     Step Navigation  5         PT Acute Tool Box     Row Name 05/03/20 0900          Cardiopulmonary    Cardiopulmonary  Cardiopulmonary: Physiologic response to position change     Row Name 05/03/20 0900          Cardiopulmonary: Physiologic Response to Position Change    Blood Pressure After Lying Supine for 5 Minutes  135/69     Blood Pressure After Sitting for 1 Minute  144/61     Blood Pressure After Standing for 1 Minute  124/59             Eval cont.     [place] Name 05/03/20 0900          [place] AM-PAC: Basic Mobility    Assistance Needed to Turn from Back to Side While in a Flat Bed Without Using Bedrails  4 - None (independent)     Difficulty with Supine to Sit Transfer  4 - None (independent)     How Much Help Needed to Move to/from Bed to Chair  3 - A little (supervised/min assist)     Difficulty with Sit to Stand Transfer from Chair with Arms  3 - A little (supervised/min assist)     How Much Help Needed to Walk in Room  3 - A little (supervised/min assist)  How Much Help Needed to Climb 3-5 Steps with a Rail  3 - A little (supervised/min assist)     AMPAC Total Score  20     Assessment: AM-PAC Basic Mobility Impairment Rating  Score 19-22 - 20-39% impaired     Row Name 05/03/20 0900          Patient/Family Education    Learner(s)  Patient     Learner response to rehab patient education interventions  Verbalizes understanding     Patient/family training comments  POC,  importance of OOB activity     Row Name 05/03/20 0900          Assessment    Assessment  Pt presents today with no dizziness, good BLE strength/ROM, good standing balance, and is overall supervised for functional mobility. Pt currently requires no skilled IP PT needs at this time as current deficits will resolve without skilled care and has appropriate assist to facilitate safe return home. Pt treatment focused on stair negotiation and gait. Pt safely demonstrates management of 5 steps using both hands on R hand rail and slowly progressing up/down steps. Pt educated on importance of frequent OOB activity given nursing permision to prevent functional decline while in hospital. PT order to be completed.      Rehab Potential  Good     Row Name 05/03/20 0900          Treatment Plan Disussion    Treatment Plan Discussion and Agreement  Patient/family/caregiver stated understanding and agreement with the therapy plan     Row Name 05/03/20 0900          Treatment Plan    Frequency of treatment  Patient appropriate for discharge from therapy     Status of treatment  Patient appropriate for discharge from therapy     [place] Name 05/03/20 0900          Patient Safety Considerations    Patient safety considerations  Patient left sitting at end of treatment;Call light left in reach and fall precautions in place;Nursing notified of safety considerations at end of treatment     Patient assistive device requirements for safe ambulation  No device required     [place] Name 05/03/20 0900          Therapy Plan Communication    Therapy Plan Communication  Discussed therapy plan with Nursing and/or Physician;Encouraged out of bed with assistance by     Encouraged out of bed with assistance by  Staff;Nursing     Row Name 05/03/20 0900          Physical Therapy Patient Discharge Instructions    Your Physical Therapist suggests the following  Supervision with walking is suggested for increased safety     Row Name 05/03/20 0900          Type of  Eval    Low Complexity (37482)  Completed     Row Name 05/03/20 0900          Therapeutic Procedures    Gait Training [phone])  Weight shift and postural control activities during gait;Stair/curb/obstacle navigation training;Postural alighnment/biomechanic training during gait;Patient education;Gait pattern analysis and treatment of deviations        Total TIMED Treatment (min)   15     Row Name 05/03/20 0900          Treatment Time     Total TIMED Treatment  (min)  30     Total Treatment Time (min)  60  Treatment start time  0815         Post Acute Discharge Recommendations  Discharge Rehabilitation Reccomendations [facility] ONLY): None- patient currently  has no further skilled therapy needs  Equipment recommendations: No equipment needed - patient has own equipment    The physical therapist of record is endorsed by evaluating physical therapist.

## 2020-05-03 NOTE — Discharge Summary (Signed)
DISCHARGE SUMMARY     Patient Name:  Carrie Knight    Date of Admission:  05/02/2020  Date of Discharge: 05/04/2020    Follow Up Recommendations:  - Empiece a tomar Eliquis 5 mg dos veces al da para evitar la formacin de cogulos de sangre debido a su fibrilacin auricular. Controle la presencia de sangre roja brillante en las heces o un cambio en el color de las heces a negro muy oscuro, y regrese al hospital si comienza a experimentar estos o cualquier otro sntoma de hemorragia.  - Empiece a tomar Kayexalate para ayudar a reducir sus niveles de potasio.  - Empiece a tomar Lopressor 25 mg [place] para controlar su fibrilacin auricular.  - Deje de tomar lisinopril porque este medicamento puede aumentar sus niveles de potasio y se Estate manager/land agent que tena niveles altos de potasio en el hospital.  - Deje de tomar Coreg 25 mg y aspirina y haga un seguimiento con su proveedor de atencin primaria para Interior and spatial designer su rgimen de Passenger transport manager.  - Siga su rgimen de Passenger transport manager como se detalla a continuacin.  - Haga un seguimiento con su PCP en 1-2 semanas para evaluar ms a fondo su enfermedad renal y ajustar sus medicamentos.  - Consulte sus prximas citas futuras a continuacin    - Please start taking Eliquis 5 mg twice a day to prevent blood clots from forming due to your atrial fibrillation. Monitor for bright red blood in your stool or a change in your stool color to very dark black, and come back to the hospital if you start experiencing these or any other concerning bleeding symptoms.  - Please start taking Kayexalate to help decrease your potassium levels.  - Please start taking Lopressor 25 mg twice a day to control your atrial fibrillation.  - Please STOP taking lisinopril because this medication can increase your potassium levels, and you were found to have high potassium levels in the hospital.  - Please STOP taking Coreg 25 mg and Aspirin, and follow up with your primary care provider to re-evaluate your  medication regimen.  - Please follow your medication regimen as detailed below  - Please follow-up with your PCP in 1-2 weeks to further evaluate your kidney disease and adjust your medications.  - Please see your upcoming future appointments below    Follow Up Appointments:  Scheduled appointments:  No future appointments.  - Should you experience any new or worsening symptoms, please report to the nearest emergency department or seek medical attention immedately.  - You will be called in regards to future appointments  - If you do not receive a call for an appointment within 10 days, please call [phone] to schedule an appointment      Principal Diagnosis: atrial fibrillation, orthostatic hypotension    Hospital Problem List:  Active Hospital Problems    Diagnosis   . *Atrial fibrillation with RVR (CMS-HCC) [I48.91]   . Gastroenteritis [K52.9]   . Hyperkalemia [E87.5]   . Type 2 diabetes mellitus with kidney complication, with long-term current use of insulin (CMS-HCC) [E11.29, Z79.4]   . Essential hypertension [I10]   . Normocytic anemia [D64.9]   . 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 Elevated troponin [R77.8]       Procedures/Significant Imaging During This Hospitalization:  X-Ray Chest Single View    Result Date: 05/02/2020  FINDINGS/ Cardiomediastinal  silhouette is within normal limits. Pulmonary vasculature is within normal limits. There is no large pleural effusion. There is no focal consolidation. There is no pneumothorax. Limited evaluation of the partially visualized osseous structures is grossly unremarkable.      Consultations Obtained During This Hospitalization:  None    History of Present Illness:  This is a 79 year old female with PMH of HTN, DM2, anemia, recurrent UTIs, urinary retention on chronic foley catheter who presents with dizziness of 1 day to a point where she was not able to stand or ambulate. She has a  spinning sensation with standing. She has had chronic diarrhea for years from the side effect of metformin that has recently been worsening over the past 2 weeks. She has been having watery and yellow diarrhea upto 6 times during the day and 4 times at nighttime. Per daughter at bedside, any time she drank/ate including water and fruits, she would have diarrhea immediately. She received oral antibiotics and anti-diarrheal medication at an outside clinic and has taken it for two days with some improvement. She has been having poor PO intake. Reports nausea and vomiting. She had some palpitations during afib with RVR. Denies fever, chills, chest pain, SOB, blood in stool, or change in baseline urinary symptoms. No recent travel, sick contacts, recent prolonged use of antibiotics, or history of C. Diff.     Of note, patient states that she saw her PCP in August who ordered labs and told her than she has anemia, uncontrolled diabetes, and abnormal renal labs. She was supposed to have an endoscopy and colonoscopy tomorrow at an OSH for further evaluation of anemia. She never had any endoscopy done in the past. She also has a history of urinary retention and incontinence on chronic foley catheter, which is changed once month. It was recently changed 4 days ago but patient reported discomfort at the foley insertion site.    Problem List:  Orthostatic hypotension  Atrial fibrillation with RVR  Acute on chronic diarrhea  Hyperkalemia    Hospital Course:  Patient was admitted for further workup of her dizziness, monitoring of diarrhea, and atrial fibrillation. Throughout her hospitalization, patient did not have another episode of diarrhea. Orthostatic vitals were performed, with results suggesting orthostatic hypotension as a result of acute on chronic diarrhea. She also had electrolyte abnormalities, including hyperkalemia, that may have contributed to her dizziness and possibly triggered the atrial fibrillation episode,  which were corrected, monitored, and stable at discharge. An echo was performed to rule out structural etiologies of atrial fibrillation, and the patient was started on metoprolol for rate control and anticoagulation with Eliquis prior to discharge. Of note, Cystatin C was checked to evaluate for kidney function and found to be elevated. Urine culture was found to be positive for gram negative rods, but patient has a chronic foley and is asymptomatic, so decision was made against treating for UTI. The remainder of her hospital course was unremarkable and she was subsequently discharged to HOME in stable condition with remainder of instructions as listed above.     Tests Outstanding at Discharge Requiring Follow Up:  Stool culture, Stool O&P    Discharge Condition:  Stable.    Key Physical Exam Findings at Discharge:  Temperature:  [97.2 F (36.2 C)-98.2 F (36.8 C)] 97.3 F (36.3 C) (09/20 1227)  Blood pressure (BP): (93-151)/(40-79) 116/74 (09/20 1227)  Heart Rate:  [68-142] 92 (09/20 1227)  Respirations:  [16-18] 18 (09/20 1227)  Pain Score: 0 (09/20  0800)  O2 Device: None (Room air) (09/20 0450)  SpO2:  [96 %-100 %] 99 % (09/20 1227)    Gen: In no acute distress. A&Ox4  Lungs:  CTA (B). Speaking in full sentences on room air.   Heart:  Irregularly irregular, S1 S2, no murmurs  Abdomen:  soft, non-tender, normoactive bowel sounds  Extremities:  2+ pulses, no edema    Recent Labs     05/02/20  1041 05/03/20  0720   WBCCOUNT 7.2 5.6   HGB 10.9* 9.8*   MCV 88.5 88.1     Recent Labs     05/02/20  1041 05/02/20  1427 05/03/20  0720   SODIUM 133* 135* 136   K 6.7* 5.1 5.9*   CO2 17* 17* 21   CL 110* 113* 109*   BUN 24 20 27*   CREAT 1.1 0.9 1.2   GLU 103 78* 116   [place] 9.4 9.2 9.2   MG 1.5*  --  1.9   PHOS 3.9  --  4.8     Recent Labs     05/02/20  1041 05/03/20  0720   TBILI 0.4 0.3   DBILI  --  0.1   ALK 91 91   AST 28 23   ALT 22 28   ALB 4.1 3.8     No results for input(s): PROTIME, PTT, INR in the last 72  hours.  No results for input(s): TROPI, TCPK, CKMB, CKIND in the last 72 hours.    Discharge Diet:  Diet Order Therapeutic; Cardiac, Consistent Carb (Diabetic); Mod 180-225gm (1600-1900 cal)    Discharge Medications:     What To Do With Your Medications      START taking these medications      Add'l Info   apixaban 5 MG  Commonly known as: ELIQUIS  Take 1 tablet (5 mg) by mouth every 12 hours.   Quantity: 60 tablet  Refills: 1     metoprolol tartrate 25 MG tablet  Commonly known as: LOPRESSOR  Take 1 tablet (25 mg) by mouth in the morning and at bedtime.   Quantity: 180 tablet  Refills: 0     SPS 15 GM/60ML suspension  Take 2 bottles (120 mL) (30 g) by mouth daily for 2 doses.  Generic drug: sodium polystyrene   Quantity: 240 mL  Refills: 0        CONTINUE taking these medications      Add'l Info   insulin glargine 100 UNIT/ML injection  Commonly known as: LANTUS  Inject 34 Units under the skin daily.   Quantity: 10 mL  Refills: 5     lidocaine 4 % patch  Commonly known as: ASPERCREME  Apply 1 patch topically every 24 hours. Leave patch on for 12 hours, then remove for 12 hours.   Quantity: 30 patch  Refills: 0     metFORMIN 1000 MG tablet  Commonly known as: GLUCOPHAGE  Take 1 tablet (1,000 mg) by mouth 2 times daily.   Quantity: 180 tablet  Refills: 3        STOP taking these medications    amoxicillin 500 MG capsule  Commonly known as: AMOXIL     aspirin 81 MG tablet     carvedilol 25 MG tablet  Commonly known as: COREG     ibuprofen 800 MG tablet  Commonly known as: MOTRIN     lisinopril 40 MG tablet  Commonly known as: PRINIVIL, ZESTRIL  Where to Get Your Medications      These medications were sent to [facility], [place] [address]    Hours: Monday-Friday 8 am to 7 pm; Saturday 9 am to 4:30 pm; Sunday 10 am to 2 pm Phone: [phone]    apixaban 5 MG   metoprolol tartrate 25 MG tablet   SPS 15 GM/60ML suspension       Allergies:  No Known Allergies    MDRO  Status: Negative    Discharge Disposition:  Home    Discharge Code Status:  Full code / full care  This code status is not changed from the time of admission.    Advance Directive on File?  No    For appointments requested for after discharge that have not yet been scheduled, refer to the Post Discharge Referrals section of the After Visit Summary.    Discharging 85 Contact Information:   Discharging 70 Contact Information: [facility] at [phone]     Chilton Si, DO  Internal Medicine, PGY-3

## 2020-05-04 ENCOUNTER — Other Ambulatory Visit: Payer: PRIVATE HEALTH INSURANCE

## 2020-05-04 DIAGNOSIS — I951 Orthostatic hypotension: Secondary | ICD-10-CM

## 2020-05-04 LAB — GLUCOSE, POINT OF CARE
Glucose, Point of Care: 108 MG/DL (ref 70–125)
Glucose, Point of Care: 149 MG/DL — ABNORMAL HIGH (ref 70–125)

## 2020-05-04 LAB — BASIC METABOLIC PANEL, BLOOD
BUN: 21 mg/dL (ref 7–25)
CO2: 23 mmol/L (ref 21–31)
Calcium: 8.5 mg/dL — ABNORMAL LOW (ref 8.6–10.3)
Chloride: 108 mmol/L — ABNORMAL HIGH (ref 98–107)
Creat: 1 mg/dL (ref 0.6–1.2)
Electrolyte Balance: 6 mmol/L (ref 2–12)
Glucose: 103 mg/dL (ref 85–125)
Potassium: 4.5 mmol/L (ref 3.5–5.1)
Sodium: 137 mmol/L (ref 136–145)
eGFR - high estimate: >60 (ref >59)
eGFR - low estimate: 54 — ABNORMAL LOW (ref >59)

## 2020-05-04 LAB — PHOSPHORUS, BLOOD: Phosphorus: 3.9 MG/DL (ref 2.5–5.0)

## 2020-05-04 LAB — MAGNESIUM, BLOOD: Magnesium: 1.6 mg/dL — ABNORMAL LOW (ref 1.9–2.7)

## 2020-05-04 MED ORDER — MAGNESIUM SULFATE 4 GM/50ML IV SOLN
4.0000 g | Freq: Once | INTRAVENOUS | Status: AC
Start: 2020-05-04 — End: 2020-05-04
  Administered 2020-05-04: 4 g via INTRAVENOUS
  Filled 2020-05-04: qty 50

## 2020-05-04 MED ORDER — APIXABAN 5 MG PO TABS
5.0000 mg | ORAL_TABLET | Freq: Two times a day (BID) | ORAL | 1 refills | Status: AC
Start: 2020-05-04 — End: ?
  Filled 2020-05-04: qty 60, 30d supply, fill #0

## 2020-05-04 MED ORDER — APIXABAN 5 MG PO TABS
5.0000 mg | ORAL_TABLET | Freq: Two times a day (BID) | ORAL | Status: DC
Start: 2020-05-04 — End: 2020-05-04
  Administered 2020-05-04: 5 mg via ORAL
  Filled 2020-05-04: qty 1

## 2020-05-04 NOTE — Plan of Care (Signed)
Nursing Shift Summary    Problem: Promotion of health and safety  Goal: Promotion of Health and Safety  Description: The patient remains safe, receives appropriate treatment and achieves optimal outcomes (physically, psychosocially, and spiritually) within the limitations of the disease process by discharge.  Outcome: Discharged  Flowsheets  Taken 05/04/2020 1338  Standard of Care/Policy:   Telemetry   Falls Reduction   Diabetes  Outcome Evaluation (rationale for progressing/not progressing) every shift:   VSS   Tolerates diet   Tolerates activity   Remains free from injury   No s/s infection or bleeding   Pain absent or well controlled  Individualized Interventions/Recommendations (Discharge Readiness): Meets discharge milestones  Individualized Interventions/Recommendations (Skin/Comfort/Safety/Mobility):   Continue frequent repositioning   Call Don't Fall precautions including changing position slowly   Ensure call light within easy reach of patient  Individualized Interventions/Recommendations (Knowledge): Ensure patient understands f/u poc  Individualized Interventions/Recommendations: Ensure patient knows how to care for f/c at home and can change from drainage bag to leg bag.  Taken 05/04/2020 0950  Patient /Family stated Goal: I hope I get to go home

## 2020-05-04 NOTE — Interdisciplinary (Signed)
Care Management Discharge Note     Discharge Plan  Living Arrangements *: Family Member  Patient/Family/Other Engaged in Discharge Planning *: Yes  Name, Relationship and Phone Number of Person Engaged in the Discharge Plan: Kensi Karr, daughter [phone]  Family/Caregiver's Assessed for *: Readiness, willingness, and ability to provide or support self-management activities  Respite Care *: Not Applicable  Patient/Family/Other Are In Agreement With Discharge Plan *: Yes  Patient Has Decision Making Capacity *: Yes (AxO)  Verified phone number for DC location *: Yes  Verified address for DC location *: Yes  Patient/Family/Legal/Surrogate Decision Maker Has Been Given a List Options And Choice In The Selection of Post-Acute Care Providers *: Not Applicable  Public Health Clearance Needed *: Not Applicable    If Medicare or Medicare Managed Care: Important Message from Medicare Given  If Medicare or Medicare Managed Care: Important Message from Medicare Given: Yes  Important Message from Medicare Date Given: 05/02/20  Family Appealed: No    MOON  MOON Provided to Patient: Not Applicable    Discharge Planning Needs  Does this patient have CM discharge planning needs?: No  Does this patient have SW discharge planning needs?: No    Discharge Transportation  Transportation * : Family/Friend  Transportation Arrangement Details *: Taraann Olthoff, daughter [phone]  Team notified of transport : Yes    Final Discharge Destination/Services  Final Discharge Destination/Services *: Home    Home Details  Home Details: home with family no needs      Pt is discharging home with no needs. Alondra Vandeven, daughter [phone] will provide transportation. Abigail Butts, RN is aware and stated she will contact dtr, however pt needs Mag Repl to finish running.

## 2020-05-04 NOTE — Interdisciplinary (Signed)
Discharge Note     Patient left after discharge instructions were given/explained to her in [place]. Pt refused the used of the translator system. Pt  verbalize understanding regarding her meds, follow up appointments and follow up care at home. Pt left with her belongings and values. Pt denied pain and/or physical discomfort during the discharge process.

## 2020-05-04 NOTE — Interdisciplinary (Signed)
Discharge Note     Patient left after discharge instructions were given/explained to her in [place]. Pt refused the used of the translator system. Pt  verbalize understanding regarding her meds, follow up appointments and follow up care at home. Pt left with her belongings and values. Pt denied pain and/or physical discomfort during the discharge process.

## 2020-05-04 NOTE — Plan of Care (Signed)
Problem: Promotion of health and safety  Goal: Promotion of Health and Safety  Description: The patient remains safe, receives appropriate treatment and achieves optimal outcomes (physically, psychosocially, and spiritually) within the limitations of the disease process by discharge.  Outcome: Progressing  Flowsheets  Taken 05/04/2020 0704  Outcome Evaluation (rationale for progressing/not progressing) every shift: Pt alert, oriented, able to transfer to and from bed and commode with supervision. Denies dizziness/weakness. No diarrhea noted. FC patent and intact. No muscle twitching or cramps/dysrhythmias.  Individualized Interventions/Recommendations (Discharge Readiness): Possible discharge later today pending lab results.  Individualized Interventions/Recommendations (Knowledge): Keep pt updated on plan of care  Individualized Interventions/Recommendations: Encourage fluid intake. Strict I&O. Monitor for adequate urine output.  Individualized Interventions/Recommendations: Monitor for diarrhea. Monitor for hypotension, headache, dizziness.  Taken 05/03/2020 1920  Patient /Family stated Goal: to go home tomorrow  Taken 05/03/2020 0532  Standard of Care/Policy:   Telemetry   Falls Reduction   Diabetes  Individualized Interventions/Recommendations (Skin/Comfort/Safety/Mobility): Keep call light within reach and bed alarm on. Check pt frequently, assist with ADLs and when out of bed.

## 2020-05-05 LAB — STANDARD O&P

## 2020-05-05 LAB — URINE CULTURE
Culture Result: >100000 — AB
Culture Result: >100000 — AB
Culture Result: >100000 — AB
Culture Result: >100000 — AB

## 2020-05-05 LAB — STOOL CULTURE: Culture Result: NEGATIVE

## 2020-05-06 ENCOUNTER — Other Ambulatory Visit: Payer: Self-pay

## 2020-05-06 ENCOUNTER — Encounter: Payer: Self-pay | Admitting: Pharmacy

## 2020-05-06 LAB — STOOL CULTURE
Culture Result: NEGATIVE
Culture Result: NEGATIVE

## 2020-05-06 MED ORDER — LOPERAMIDE HCL 2 MG OR CAPS: 2.0000 mg | ORAL_CAPSULE | ORAL | Status: AC | PRN

## 2020-05-06 MED ORDER — IRON PO: ORAL | Status: AC

## 2020-05-06 NOTE — Progress Notes (Signed)
Pharmacy Transition of Care Management         Admitted: 05/02/20  Discharged: 05/04/20            The patient's daughter was contacted by Transitional Care Pharmacist via telephone today for post discharge follow-up and medication reconciliation with education.      Spoke to patient's daughter at [phone], [name] provided [place] translation    Chief Complaint:   Carrie Knight is a 79 year old female who was recently hospitalized secondary to atrial fibrillation and orthostatic hypotension  Past Medical History:   Diagnosis Date   . Diabetes mellitus (CMS-HCC)    . Heart palpitations    . Hypertension        MEDICATION RECONCILIATION:  Current home meds after verifying with patient/caregiver  Current Outpatient Medications   Medication Sig   . apixaban (ELIQUIS) 5 MG Take 1 tablet (5 mg) by mouth every 12 hours.   . Ferrous Sulfate (IRON PO) Patient does not know strength, reports taking 1-2 times/day   . insulin glargine (LANTUS) 100 UNIT/ML injection Inject 34 Units under the skin daily.   Marland Kitchen loperamide (IMODIUM) 2 MG capsule Take 2 mg by mouth as needed for Diarrhea.   . metFORMIN (GLUCOPHAGE) 1000 MG tablet Take 1 tablet (1,000 mg) by mouth 2 times daily.   . metoprolol tartrate (LOPRESSOR) 25 MG tablet Take 1 tablet (25 mg) by mouth in the morning and at bedtime.   . sodium polystyrene, with sorbitol, (KAYEXALATE) 15 GM/60ML suspension Take 2 bottles (120 mL) (30 g) by mouth daily for 2 doses.     No current facility-administered medications for this visit.       MEDICATION CHANGE NOTED ON HOSPITAL DISCHARGE:  New: Eliquis, Lopressor, SPS  Discontinued: Amoxicillin, Aspirin, Carvedilol, Ibuprofen, Lisinopril   Dose changed: NA    Contacted Physician/ Provider/Pharmacy: No    Medication Problems/ Interventions:        Total number of meds PRIOR to [facility] phone call: 6   Medication discrepancy: 3   # of Medications Omitted: 2 - Ferrous sulfate (unaware of strength but most likely  325mg ), Imodium   # of Medications Removed: 1 - Lidocaine patch (patient not taking)   # of Meds with Dose/Rate/Frequency Modifications: NA   Medication non-adherence: NA   Duplicate therapy: NA   Therapy Omission: NA   No indications: NA   Adverse drug reactions: NA   Drug Drug interactions: NA   Drug Disease Interactions: NA   Prescribing error: NA   Appointment referral: NA      ASSESSMENT / PLAN:    Medications   Patient has been able to obtain and take all of the medications per discharge instructions    All medications including over the counter medications, herbal therapies, supplements reviewed with patient / caregiver and medication list reconciled.  . Discussed with patient's daughter the discharge medication indication, dosing, frequency, common side effects and potential drug-drug interactions (using teach-back method)  . Patient is not having financial issues obtaining medications    . Patient is tolerating meds well, experiencing slight side effects at present.  o Patient's daughter reported slight fatigue  . Patient has no issue with medications. All questions were explained and answered.     Symptoms/Diagnosis  New symptoms or problems that have occurred since hospital discharge:  - Patient's daughter reports no dizziness, diarrhea, poor appetite, nausea/vomiting, or chest palpitations  - Patient's daughter reports slight fatigue, could potentially be due to starting  beta blocker     Follow-up Appointments    Patient's daugther was reminded of the upcoming appointments:  - Knows to schedule appointment to follow-up with PCP in 1-2 weeks    Patient was instructed to call [phone] to schedule an appointment if she does not receive a call for an a appointment within 10 days    Patient's daughter confirms she understood discharge instructions well.    Next Scheduled Follow-up Call: approximately 1 week (around 05/13/20)    Medication education and review provided by  Zada Girt Pharmcy  Student   05/06/2020       Time Spent: 60 minutes

## 2020-05-07 ENCOUNTER — Emergency Department: Payer: No Typology Code available for payment source

## 2020-05-07 ENCOUNTER — Inpatient Hospital Stay
Admission: AD | Admit: 2020-05-07 | Discharge: 2020-05-09 | DRG: 641 | Disposition: A | Discharge status: Home / Self Care | Payer: No Typology Code available for payment source | Attending: Internal Medicine | Admitting: Internal Medicine

## 2020-05-07 DIAGNOSIS — N184 Chronic kidney disease, stage 4 (severe): Secondary | ICD-10-CM | POA: Diagnosis present

## 2020-05-07 DIAGNOSIS — Z90710 Acquired absence of both cervix and uterus: Secondary | ICD-10-CM

## 2020-05-07 DIAGNOSIS — I10 Essential (primary) hypertension: Secondary | ICD-10-CM | POA: Diagnosis present

## 2020-05-07 DIAGNOSIS — E1129 Type 2 diabetes mellitus with other diabetic kidney complication: Secondary | ICD-10-CM | POA: Diagnosis present

## 2020-05-07 DIAGNOSIS — Z20822 Contact with and (suspected) exposure to covid-19: Secondary | ICD-10-CM | POA: Diagnosis present

## 2020-05-07 DIAGNOSIS — D649 Anemia, unspecified: Secondary | ICD-10-CM

## 2020-05-07 DIAGNOSIS — D509 Iron deficiency anemia, unspecified: Secondary | ICD-10-CM

## 2020-05-07 DIAGNOSIS — R2689 Other abnormalities of gait and mobility: Secondary | ICD-10-CM

## 2020-05-07 DIAGNOSIS — N3090 Cystitis, unspecified without hematuria: Secondary | ICD-10-CM | POA: Diagnosis present

## 2020-05-07 DIAGNOSIS — Z794 Long term (current) use of insulin: Secondary | ICD-10-CM

## 2020-05-07 DIAGNOSIS — N390 Urinary tract infection, site not specified: Secondary | ICD-10-CM

## 2020-05-07 DIAGNOSIS — D508 Other iron deficiency anemias: Secondary | ICD-10-CM | POA: Diagnosis present

## 2020-05-07 DIAGNOSIS — I480 Paroxysmal atrial fibrillation: Secondary | ICD-10-CM | POA: Diagnosis present

## 2020-05-07 DIAGNOSIS — I4891 Unspecified atrial fibrillation: Secondary | ICD-10-CM

## 2020-05-07 DIAGNOSIS — Z87440 Personal history of urinary (tract) infections: Secondary | ICD-10-CM

## 2020-05-07 DIAGNOSIS — B961 Klebsiella pneumoniae [K. pneumoniae] as the cause of diseases classified elsewhere: Secondary | ICD-10-CM | POA: Diagnosis present

## 2020-05-07 DIAGNOSIS — I129 Hypertensive chronic kidney disease with stage 1 through stage 4 chronic kidney disease, or unspecified chronic kidney disease: Secondary | ICD-10-CM | POA: Diagnosis present

## 2020-05-07 DIAGNOSIS — E1122 Type 2 diabetes mellitus with diabetic chronic kidney disease: Secondary | ICD-10-CM | POA: Diagnosis present

## 2020-05-07 DIAGNOSIS — E861 Hypovolemia: Secondary | ICD-10-CM | POA: Diagnosis present

## 2020-05-07 DIAGNOSIS — B9620 Unspecified Escherichia coli [E. coli] as the cause of diseases classified elsewhere: Secondary | ICD-10-CM | POA: Diagnosis present

## 2020-05-07 DIAGNOSIS — R42 Dizziness and giddiness: Secondary | ICD-10-CM

## 2020-05-07 DIAGNOSIS — Z7409 Other reduced mobility: Secondary | ICD-10-CM | POA: Diagnosis present

## 2020-05-07 LAB — TROPONIN I, HIGH SENSITIVITY: Troponin I, High Sensitivity: 11 ng/L (ref 0–15)

## 2020-05-07 LAB — CBC WITH DIFF, BLOOD
ANC automated: 8.2 10*3/uL — ABNORMAL HIGH (ref 2.0–8.1)
Basophils %: 0.2 %
Basophils Absolute: 0 10*3/uL (ref 0.0–0.2)
Eosinophils %: 0.6 %
Eosinophils Absolute: 0.1 10*3/uL (ref 0.0–0.5)
Hematocrit: 28.9 % — ABNORMAL LOW (ref 34.0–44.0)
Hgb: 9.9 G/DL — ABNORMAL LOW (ref 11.5–15.0)
Lymphocytes %: 11.1 %
Lymphocytes Absolute: 1.1 10*3/uL (ref 0.9–3.3)
MCH: 29.9 PG (ref 27.0–33.5)
MCHC: 34.1 G/DL (ref 32.0–35.5)
MCV: 87.6 FL (ref 81.5–97.0)
MPV: 7.6 FL (ref 7.2–11.7)
Monocytes %: 4.9 %
Monocytes Absolute: 0.5 10*3/uL (ref 0.0–0.8)
Neutrophils % (A): 83.2 %
PLT Count: 300 10*3/uL (ref 150–400)
RBC: 3.3 10*6/uL — ABNORMAL LOW (ref 3.70–5.00)
RDW-CV: 15.2 % — ABNORMAL HIGH (ref 11.6–14.4)
White Bld Cell Count: 9.8 10*3/uL (ref 4.0–10.5)

## 2020-05-07 LAB — COMPREHENSIVE METABOLIC PANEL, BLOOD
ALT: 18 U/L (ref 7–52)
AST: 15 U/L (ref 13–39)
Albumin: 4.3 G/DL (ref 3.7–5.3)
Alk Phos: 90 U/L (ref 34–104)
BUN: 15 mg/dL (ref 7–25)
Bilirubin, Total: 0.5 mg/dL (ref 0.0–1.4)
CO2: 21 mmol/L (ref 21–31)
Calcium: 9.2 mg/dL (ref 8.6–10.3)
Chloride: 102 mmol/L (ref 98–107)
Creat: 1.1 mg/dL (ref 0.6–1.2)
Electrolyte Balance: 10 mmol/L (ref 2–12)
Glucose: 215 mg/dL — ABNORMAL HIGH (ref 85–125)
Potassium: 4.6 mmol/L (ref 3.5–5.1)
Protein, Total: 7 G/DL (ref 6.0–8.3)
Sodium: 133 mmol/L — ABNORMAL LOW (ref 136–145)
eGFR - high estimate: 58 — ABNORMAL LOW (ref >59)
eGFR - low estimate: 48 — ABNORMAL LOW (ref >59)

## 2020-05-07 LAB — COVID-19, FLU A/B PANEL (POC)
COVID-19 Result: NOT DETECTED
Influenza A, PCR: NOT DETECTED
Influenza B, PCR: NOT DETECTED
Respiratory Virus Comment: NOT DETECTED

## 2020-05-07 LAB — URINALYSIS WITH CULTURE REFLEX, WHEN INDICATED
Bilirubin, UA: NEGATIVE
Glucose, UA: 50 MG/DL — AB
Ketones, UA: 5 MG/DL — AB
Nitrite, UA: POSITIVE — AB
Protein, UA: 30 MG/DL — AB
RBC, UA: 47 #/HPF — ABNORMAL HIGH (ref 0–3)
Specific Grav, UA: 1.006 (ref 1.003–1.030)
Squamous Epithelial, UA: <1 /HPF (ref 0–10)
Urobilinogen, UA: <2 MG/DL (ref <2.0)
WBC, UA: 25 #/HPF — ABNORMAL HIGH (ref 0–5)
pH, UA: 5 (ref 5.0–8.0)

## 2020-05-07 LAB — PT/INR/PTT
INR: 1.47 — ABNORMAL HIGH (ref 0.90–1.10)
PTT: 37.1 s — ABNORMAL HIGH (ref 23.9–35.9)
Prothrombin Time: 17 s — ABNORMAL HIGH (ref 11.5–13.5)

## 2020-05-07 LAB — MAGNESIUM, BLOOD
Magnesium: 1.4 mg/dL — ABNORMAL LOW (ref 1.9–2.7)
Magnesium: 2.3 mg/dL (ref 1.9–2.7)

## 2020-05-07 LAB — BNP, BLOOD: BNP: 211 pg/mL — ABNORMAL HIGH (ref 0–100)

## 2020-05-07 LAB — PHOSPHORUS, BLOOD: Phosphorus: 3 MG/DL (ref 2.5–5.0)

## 2020-05-07 LAB — LACTATE, BLOOD: Lactic Acid: 1.4 mmol/L (ref 0.5–2.0)

## 2020-05-07 LAB — LIPASE, BLOOD: Lipase: 9 U/L — ABNORMAL LOW (ref 11–82)

## 2020-05-07 LAB — THYROID CASCADE: TSH, Ultrasensitive: 0.686 u[IU]/mL (ref 0.450–4.120)

## 2020-05-07 MED ORDER — METOPROLOL TARTRATE 25 MG OR TABS
25.0000 mg | ORAL_TABLET | Freq: Two times a day (BID) | ORAL | Status: DC
Start: 2020-05-07 — End: 2020-05-08
  Administered 2020-05-07 – 2020-05-08 (×3): 25 mg via ORAL
  Filled 2020-05-07 (×2): qty 1

## 2020-05-07 MED ORDER — POVIDONE-IODINE 10 % EX SWAB
1.0000 | Freq: Two times a day (BID) | CUTANEOUS | Status: DC
Start: 2020-05-21 — End: 2020-05-09

## 2020-05-07 MED ORDER — SENNA 8.6 MG OR TABS
17.2000 mg | ORAL_TABLET | Freq: Every evening | ORAL | Status: DC | PRN
Start: 2020-05-07 — End: 2020-05-09

## 2020-05-07 MED ORDER — INSULIN GLARGINE 100 UNIT/ML SC SOLN
15.0000 [IU] | Freq: Every evening | SUBCUTANEOUS | Status: DC
Start: 2020-05-07 — End: 2020-05-09
  Administered 2020-05-07 – 2020-05-08 (×2): 15 [IU] via SUBCUTANEOUS
  Filled 2020-05-07 (×2): qty 15

## 2020-05-07 MED ORDER — DEXTROSE 50 % IV SOLN
12.5000 mL | INTRAVENOUS | Status: DC | PRN
Start: 2020-05-07 — End: 2020-05-09

## 2020-05-07 MED ORDER — SODIUM CHLORIDE 0.9 % IJ SOLN (CUSTOM)
50.0000 mL | Freq: Once | INTRAMUSCULAR | Status: DC | PRN
Start: 2020-05-07 — End: 2020-05-09

## 2020-05-07 MED ORDER — RENAL DOSING PROTOCOL FOR ANTI-INFECTIVE PER PHARMACY
INTRAVENOUS | Status: DC
Start: 2020-05-07 — End: 2020-05-08

## 2020-05-07 MED ORDER — POVIDONE-IODINE 10 % EX SWAB
1.0000 | Freq: Two times a day (BID) | CUTANEOUS | Status: DC
Start: 2020-06-04 — End: 2020-05-09

## 2020-05-07 MED ORDER — DEXTROSE 10 % IV SOLN
50.0000 mL/h | INTRAVENOUS | Status: DC | PRN
Start: 2020-05-07 — End: 2020-05-09

## 2020-05-07 MED ORDER — LACTATED RINGERS IV BOLUS (~~LOC~~)
1000.0000 mL | Freq: Once | INTRAVENOUS | Status: AC
Start: 2020-05-07 — End: 2020-05-07
  Administered 2020-05-07: 1000 mL via INTRAVENOUS

## 2020-05-07 MED ORDER — METOPROLOL TARTRATE 5 MG/5ML IV SOLN
5.0000 mg | Freq: Once | INTRAVENOUS | Status: AC
Start: 2020-05-07 — End: 2020-05-07
  Administered 2020-05-07: 5 mg via INTRAVENOUS
  Filled 2020-05-07: qty 5

## 2020-05-07 MED ORDER — ENOXAPARIN SODIUM 40 MG/0.4ML SC SOLN
40.0000 mg | Freq: Every day | SUBCUTANEOUS | Status: DC
Start: 2020-05-08 — End: 2020-05-07

## 2020-05-07 MED ORDER — FERROUS SULFATE 325 (65 FE) MG OR TABS
325.0000 mg | ORAL_TABLET | Freq: Every day | ORAL | Status: DC
Start: 2020-05-08 — End: 2020-05-09
  Administered 2020-05-08 – 2020-05-09 (×2): 325 mg via ORAL
  Filled 2020-05-07 (×2): qty 1

## 2020-05-07 MED ORDER — DEXTROSE 50 % IV SOLN
25.0000 mL | INTRAVENOUS | Status: DC | PRN
Start: 2020-05-07 — End: 2020-05-09

## 2020-05-07 MED ORDER — POVIDONE-IODINE 10 % EX SWAB
1.0000 | Freq: Two times a day (BID) | CUTANEOUS | Status: DC
Start: 2020-05-07 — End: 2020-05-09
  Administered 2020-05-07 – 2020-05-09 (×5): 1 via NASAL

## 2020-05-07 MED ORDER — INSULIN LISPRO (HUMAN) 100 UNIT/ML SC SOLN (~~LOC~~)
5.0000 [IU] | Freq: Three times a day (TID) | SUBCUTANEOUS | Status: DC
Start: 2020-05-08 — End: 2020-05-09
  Administered 2020-05-08 – 2020-05-09 (×4): 5 [IU] via SUBCUTANEOUS

## 2020-05-07 MED ORDER — METOPROLOL TARTRATE 12.5 MG OR TABS (HALF TABLET)
12.5000 mg | Freq: Once | Status: DC
Start: 2020-05-07 — End: 2020-05-08

## 2020-05-07 MED ORDER — SODIUM CHLORIDE 0.9 % IV SOLN
3.3750 g | Freq: Four times a day (QID) | INTRAVENOUS | Status: DC
Start: 2020-05-07 — End: 2020-05-08
  Administered 2020-05-07 – 2020-05-08 (×5): 3.375 g via INTRAVENOUS
  Filled 2020-05-07 (×4): qty 3375

## 2020-05-07 MED ORDER — DEXTROSE 50 % IV SOLN
50.0000 mL | INTRAVENOUS | Status: DC | PRN
Start: 2020-05-07 — End: 2020-05-09

## 2020-05-07 MED ORDER — ACETAMINOPHEN 325 MG PO TABS
650.0000 mg | ORAL_TABLET | Freq: Once | ORAL | Status: AC
Start: 2020-05-07 — End: 2020-05-07
  Administered 2020-05-07 (×2): 650 mg via ORAL
  Filled 2020-05-07: qty 2

## 2020-05-07 MED ORDER — IOHEXOL 350 MG/ML IV SOLN
100.0000 mL | Freq: Once | INTRAVENOUS | Status: AC
Start: 2020-05-07 — End: 2020-05-07
  Administered 2020-05-07 (×2): 100 mL via INTRAVENOUS

## 2020-05-07 MED ORDER — MAGNESIUM SULFATE 4 GM/50ML IV SOLN
4.0000 g | Freq: Once | INTRAVENOUS | Status: AC
Start: 2020-05-07 — End: 2020-05-07
  Administered 2020-05-07: 4 g via INTRAVENOUS
  Filled 2020-05-07: qty 50

## 2020-05-07 MED ORDER — SODIUM CHLORIDE 0.9 % IV SOLN
3.3750 g | Freq: Once | INTRAVENOUS | Status: AC
Start: 2020-05-07 — End: 2020-05-07
  Administered 2020-05-07: 3.375 g via INTRAVENOUS
  Filled 2020-05-07: qty 3375

## 2020-05-07 MED ORDER — GLUCAGON HCL (DIAGNOSTIC) 1 MG IJ SOLR
1.0000 mg | INTRAMUSCULAR | Status: DC | PRN
Start: 2020-05-07 — End: 2020-05-09

## 2020-05-07 MED ORDER — APIXABAN 5 MG PO TABS
5.0000 mg | ORAL_TABLET | Freq: Two times a day (BID) | ORAL | Status: DC
Start: 2020-05-07 — End: 2020-05-09
  Administered 2020-05-07 – 2020-05-09 (×4): 5 mg via ORAL
  Filled 2020-05-07 (×4): qty 1

## 2020-05-07 MED ORDER — SODIUM CHLORIDE FLUSH 0.9 % IV SOLN
10.0000 mL | Freq: Once | INTRAVENOUS | Status: DC | PRN
Start: 2020-05-07 — End: 2020-05-09

## 2020-05-07 NOTE — ED Notes (Signed)
Pt being transport to the floor via ACLS by Angelique, RN. Pt placed on continuous cardiac monitoring, pulse oximetry and BP cuff. All belongings with pt at this time and have been inventoried. VSS and pt stable for transport to the floor at this time. Care being endorsed to [facility], RN at this time.

## 2020-05-07 NOTE — ED Notes (Signed)
Clarified order with Dr. Hale Bogus, give Lopressor 12.5mg  in 30 minutes if Systolic BP is > 665. Dr. Hale Bogus aware that 25mg  was already given. Order was verified with verbal read-back.

## 2020-05-07 NOTE — ED Provider Notes (Signed)
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 Patient was recently hospitalized 9/19-9/21 for chief complaint of dizziness and diarrhea, found to have hyperkalmia, hypomagnesemia, and afib with RVR. She received PO metoprolol for rate control and dizziness was resolved on discharge, discharged with eliquis and metoprolol tartrate. Pt reports dizziness started again the morning after discharge, feels as if the room is spinning, symptoms do not change with eyes opened/closed. Nausea started this morning with yellow, non-bloody emesis. Patient has an indwelling urinary catheter which was last changed earlier this week. Also reports mild diffuse abdominal pain. No recent falls or trauma. Denies headache, chest pain, sensation of palpitations, SOB, diarrhea.    Location: Lower abdominal pain  Radiation: None  Quality: +N/V, dizziness  Severity: Moderate  Duration: 1-2 days  Timing: Constant       Echo from 9/20:  Summary:   1. Normal left ventricular systolic function. The left ventricular ejection fraction is 68% by Biplane.   2. Normal right ventricular size and systolic function.   3. Left atrium moderately dilated.   4. Mild mitral annular calcification.   5. No significant valvular dysfunction.   6. Please note patient in atrial fibrillation during study.       REVIEW OF SYSTEMS:  Constitutional: No fever  Head: No headache  CV: No chest pain  Resp: No shortness of breath  GI: +vomiting    All other systems reviewed and negative except as noted above    PAST MEDICAL HISTORY:  Past Medical History:   Diagnosis Date    Diabetes mellitus (CMS-HCC)     Heart palpitations     Hypertension       Patient  Active Problem List    Diagnosis Date Noted    Gastroenteritis 05/03/2020    Atrial fibrillation with RVR (CMS-HCC) 05/03/2020    Hyperkalemia 05/03/2020    Type 2 diabetes mellitus with kidney complication, with long-term current use of insulin (CMS-HCC) 05/03/2020    Essential hypertension 05/03/2020    Normocytic anemia 05/03/2020    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) 05/03/2020       SURGICAL HISTORY:  Past Surgical History:   Procedure Laterality Date    BLADDER SURGERY      HYSTERECTOMY        R eye cataract removal     ALLERGIES:  No Known Allergies      CURRENT MEDICATIONS:   Please see nursing notes    FAMILY HISTORY:  Reviewed and considered non-contributory     SOCIAL HISTORY:  Tobacco: None  Alcohol: None  Drug use: None    VITAL SIGNS:  First Vitals [05/07/20 0629]   Temperature Heart Rate Respirations Blood pressure (BP) SpO2   98.4 F (36.9 C) 104 18 154/67 98 %       PHYSICAL EXAM:  General: Awake, Alert, appears to be in mild apparent distress, Intermittently falling asleep on exam   Head: Normocephalic, atraumatic   Eyes: No scleral icterus, no conjunctival injection  ENT: Normal appearing ears externally, normal appearing nose externally  Neck: Supple, no tracheal deviation  Respiratory: Normal effort, no audible stridor, CTAB  Cardiovascular: Tachycardic  rate, irregular rhythm, warm and well perfused       Abdomen: Soft, diffusely tender to palpation worse in lower abdomen  Back:No costovertebral angle tenderness  Skin: No jaundice, No rash   Extremities: No edema  Neuro: Face symmetric, normal speech    MEDICAL DECISION MAKING:  Carrie Knight is a 79 year old female who presents with dizziness, N/V in the context of recent hospitalization for afib, hyperkalemia, and hypomag. Differential diagnosis includes afib, electrolyte abnormality, UTI vs intraabdominal infection, CVA. Will obtain UA, CMP, CBC, trop, lipase, CXR, CTH, CT A/P.     I have reviewed the  patient's labs which were notable for hypomag to 1.4 and positive UA, cultures pending. I reviewed the patient's imaging which showed concern for bladder infection, infectious vs inflammatory enterocolitis on CT A/P; no acute intracranial abnormality on CTH. I have also reviewed prior records which were summarized in my HPI.    ED COURSE:  Workup Summary       Value   Comment By Time       Admitted: 79 year old female admitted to medicine service for UTI/hypomag.-level of care: tele Dornhofer, Marylyn Ishihara, MD 09/24 1849       [place] to [place]: 38F recurrent UTI&#39;s, DM, HTN, coming with lightheadedness for 1 week +n/v. In afib w RVR here, newly diagnosed last week, improved on metop. On eliquis. Indwelling cath. Current symptomatic UTI. Low Mg Jabier Gauss, MD 09/24 1839       Dr. Levada Dy to [facility]: Pending transfer to [facility] global.  Marinell Blight, MD 09/24 1810       38F pending transfer to outside facility for dizziness Janetta Hora, MD 09/24 1421       79 yo dizzy. Labs/imaging negative. Norton Blizzard, MD 09/24 [phone]       Doc to doc done. Pt will transfer to their facility for admission for afib w RVR, UTI, hypomagnesemia. Dr. Vergia Alberts accepted.  Dornhofer, Marylyn Ishihara, MD 09/24 1414    CT Abdomen And Pelvis With Contrast   1.  Suspect cystitis as evidenced with bladder wall thickening and perivesical stranding.2.  Suspect infectious or inflammatory enterocolitis as evidenced with wall thickening of small bowel loops in the pelvis and mild pancolonic wall thickening. No bowel obstruction, pneumoperitoneum, or drainable walled off collection. Dornhofer, Marylyn Ishihara, MD 09/24 1333    CT Head W/O Contrast   1. No acute intracranial abnormality.2. Chronic findings, as above. Dornhofer, Marylyn Ishihara, MD 09/24 1225       Pending CT Dornhofer, Marylyn Ishihara, MD 09/24 1119    X-Ray Chest Single View   There is no focal pulmonary infiltrate or consolidation.There is no pneumothorax. There is no large pleural effusion. The cardiomediastinal silhouette is  unchanged. The pulmonary vasculature is unremarkable. There is no acute osseous abnormality. No interval change compared to 05/02/2020. Dornhofer, Marylyn Ishihara, MD 09/24 1054       UA w/ 25 WBC, +leuk esterase, nitrites, bacteria, sent to micro Runge, Ava 09/24 1052       HR at beside 90s-110s Runge, Ava 09/24 1051       Pending Ct Dornhofer, Marylyn Ishihara, MD 09/24 1010    Heart Rate: 124   (Reviewed) Dornhofer, Marylyn Ishihara, MD 09/24 9604       Bnp 211, troponin nl Valentino Hue, Ava 09/24 0834      Magnesium: 1.4   (Reviewed) Dornhofer, Marylyn Ishihara, MD 09/24 0820      Hgb: 9.9   Similar to prior level 4 days ago.  Dornhofer, Marylyn Ishihara, MD 09/24 0315             DIAGNOSIS:    ICD-10-CM ICD-9-CM   1. Hypomagnesemia  E83.42 275.2   2. Lightheaded  R42 780.4   3. Urinary tract infection associated with indwelling urethral catheter, subsequent encounter  T83.511D V58.89    N39.0 996.64     599.0   4. Atrial fibrillation with RVR (CMS-HCC)  I48.91 427.31   5. Decreased functional mobility  R26.89 781.99       I was physically present with the medical student during the performance of the history, examination and we jointly participated in medical decision-making. I reviewed and agree with the findings and plan as documented by the medical student. My additions and revisions are included in the medical record.       Norton Blizzard, MD  05/08/20 [phone]

## 2020-05-07 NOTE — ED Notes (Signed)
Md Dornhoffer at bedside

## 2020-05-07 NOTE — ED Notes (Signed)
Dinner tray given

## 2020-05-07 NOTE — ED Notes (Signed)
VSS at this time. Pt resting comfortably in gurney without any notable distress. Pt is easily arousable to voice and no distress noted. Will continue to monitor.

## 2020-05-07 NOTE — ED Notes (Signed)
Resuming care for pt at this time. Per Shawn, RN, no acute changes in pt condition. Will continue to monitor.

## 2020-05-07 NOTE — ED Notes (Signed)
Phonebook  Phonebook - Page Status  Sent To:  Ronald Pippins   Pager#:  [phone]   Date:  05/07/2020   Time:  8:54 PM   Message:  From: Tor Netters: [place] for pt Carrie Knight being admitted for hypomagnesemia, did you mean to place the Lopressor tablet 12.5mg  as a PRN? I already gave 25mg . Thanks.   Your page has been sent!   Thank you for using the [facility].

## 2020-05-07 NOTE — H&P (Signed)
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 Patient was recently hospitalized 9/19-9/21 for chief complaint of dizziness and diarrhea, found to have hyperkalmia, hypomagnesemia, and afib with RVR. She received PO metoprolol for rate control and dizziness was resolved on discharge, discharged with eliquis and metoprolol tartrate.     Pt reports dizziness started again the morning after discharge, feels as if the room is spinning, symptoms do not change with eyes opened/closed. Dizziness worsens when pt stands up and she denies muscle weakness in her extremities. Nausea started this morning with yellow, non-bloody emesis. Patient has an indwelling urinary catheter which was last changed earlier this week. Her urinary catheter was placed 5 years ago and pt has not urinated without it since its placement. Also reports mild diffuse abdominal pain. No recent falls or trauma. Denies headache, chest pain, sensation of palpitations, SOB, diarrhea.     ED Course:  Pt received Mg 4g, Metoprolol IV 46m, Zosyn IV 3.375g, CTAP, CT Head, and CXR. Urine culture was ordered.      PAST MEDICAL / SURGICAL HISTORY     Past Medical History:   Diagnosis Date    Diabetes mellitus (CMS-HCC)     Heart palpitations     Hypertension      Past Surgical History:   Procedure Laterality Date    BLADDER SURGERY      HYSTERECTOMY         FAMILY HISTORY    Reviewed and considered non-contributory     SOCIAL HISTORY   Tobacco: None  Alcohol: None  Drug use: None    ALLERGIES   No Known Allergies    MEDICATIONS   HOME MEDS:  (Not in a hospital admission)    SCHEDULED MEDS:   [START ON 05/08/2020] enoxaparin  40 mg Daily     PRN MEDS:   senna  17.2 mg Nightly PRN    sodium chloride  10 mL Once PRN    sodium chloride  50 mL Once PRN       REVIEW OF SYMPTOMS    Positive ROS in bold  Constitutional:  Fever, chills, weight loss, weight gain  Head & Neck:  Headache, vision change, hearing change, rhinorrhea, sore throat  Respiratory:  Shortness of breath, cough, sputum, hemoptysis  Cardiovascular:  Chest pain/pressure, orthopnea, PND, palpitations  Gastrointestinal: Abdominal pain, nausea, vomiting, diarrhea, melena, rectal bleeding  Genitourinary:  Dysuria, polyuria, hesitancy, frequency  Musculoskeletal:  Joint pain, leg edema  Neurological:  Focal numbness, weakness, seizure    OBJECTIVE:   VITALS  Blood pressure (BP): 120/61   Heart Rate: 100   Temperature: 98.4 F (36.9 C)  Respirations: 18   Height: 4' 9"  (144.8 cm)   Weight: 63.5 kg (140 lb)  Heart Rate Last 24 hrs: Pulse  Avg: 111.6  Min: 100  Max: 130  SBP range: Systolic (43XVQ), MGQ:676 , Min:120 , Max:154   DBP range: Diastolic (19JKD), TOI:71, Min:61, Max:71    I/O  Intake/Output Summary (Last 24 hours) at 05/07/2020 1852  Last data filed at 05/07/2020 1600  Gross per 24 hour   Intake --   Output 650 ml   Net -650 ml       PHYSICAL EXAM   General: Awake, Alert, appears to be in mild apparent distress, Intermittently falling asleep on exam   Head: Normocephalic, atraumatic   Eyes: No scleral icterus, no conjunctival injection  ENT: Normal appearing ears externally, normal appearing nose externally  Neck: Supple, no tracheal deviation  Respiratory: Normal effort, no audible stridor, CTAB  Cardiovascular: Tachycardic rate, irregular rhythm, warm and well perfused       Abdomen: Soft, diffusely tender to palpation  worse in lower abdomen  Back:No costovertebral angle tenderness  Skin: No jaundice, No rash   Extremities: No edema  Neuro: Face symmetric, normal speech      DATA:    [START ON 05/08/2020] enoxaparin  40 mg Daily       Current Facility-Administered Medications:     [START ON 05/08/2020] enoxaparin (LOVENOX) injection 40 mg, 40 mg, Subcutaneous, Daily, Azam, Sarah, DO    senna (SENOKOT) tablet 17.2 mg, 17.2 mg, Oral, Nightly PRN, Hale Bogus, Sarah, DO    sodium chloride 0.9 % flush 10 mL, 10 mL, IntraVENOUS, Once PRN, Norton Blizzard, MD    sodium chloride 0.9 % flush 50 mL, 50 mL, IntraVENOUS, Once PRN, Norton Blizzard, MD    Current Outpatient Medications:     apixaban (ELIQUIS) 5 MG, Take 1 tablet (5 mg) by mouth every 12 hours., Disp: 60 tablet, Rfl: 1    Ferrous Sulfate (IRON PO), Patient does not know strength, reports taking 1-2 times/day, Disp: , Rfl:     insulin glargine (LANTUS) 100 UNIT/ML injection, Inject 34 Units under the skin daily., Disp: 10 mL, Rfl: 5    loperamide (IMODIUM) 2 MG capsule, Take 2 mg by mouth as needed for Diarrhea., Disp: , Rfl:     metFORMIN (GLUCOPHAGE) 1000 MG tablet, Take 1 tablet (1,000 mg) by mouth 2 times daily., Disp: 180 tablet, Rfl: 3    metoprolol tartrate (LOPRESSOR) 25 MG tablet, Take 1 tablet (25 mg) by mouth in the morning and at bedtime., Disp: 180 tablet, Rfl: 0    sodium polystyrene, with sorbitol, (KAYEXALATE) 15 GM/60ML suspension, Take 2 bottles (120 mL) (30 g) by mouth daily for 2 doses., Disp: 240 mL, Rfl: 0    LABS:   Recent Labs     05/07/20  0654   WBCCOUNT 9.8   HGB 9.9*   MCV 87.6     Recent Labs     05/07/20  0654   SODIUM 133*   K 4.6   CO2 21   CL 102   BUN 15   CREAT 1.1   GLU 215*   [place] 9.2   MG 1.4*   PHOS 3.0     Recent Labs     05/07/20  0654   TBILI 0.5   ALK 90   AST 15   ALT 18   ALB 4.3     Recent Labs     05/07/20  0654   PROTIME  17.0*   PTT 37.1*   INR 1.47*         IMAGING      X-Ray Chest Single View    Result Date: 05/07/2020  TECHNIQUE: Single portable  semiupright frontal view of the chest was obtained. FINDINGS/ There is no focal pulmonary infiltrate or consolidation.There is no pneumothorax. There is no large pleural effusion. The cardiomediastinal silhouette is unchanged. The pulmonary vasculature is unremarkable. There is no acute osseous abnormality. No interval change compared to 05/02/2020. END I have personally reviewed the images upon which this report is based and agree with the findings and conclusions expressed above.    X-Ray Chest Single View    Result Date: 05/02/2020  FINDINGS/ Cardiomediastinal silhouette is within normal limits. Pulmonary vasculature is within normal limits. There is no large pleural effusion. There is no focal consolidation. There is no pneumothorax. Limited evaluation of the partially visualized osseous structures is grossly unremarkable.    CT Head W/O Contrast    Result Date: 05/07/2020  1. No acute intracranial abnormality. 2. Chronic findings, as above.    CT Abdomen And Pelvis With Contrast    Result Date: 05/07/2020  1.  Suspect cystitis as evidenced with bladder wall thickening and perivesical stranding. 2.  Suspect infectious or inflammatory enterocolitis as evidenced with wall thickening of small bowel loops in the pelvis and mild pancolonic wall thickening. No bowel obstruction, pneumoperitoneum, or drainable walled off collection. END       ASSESSMENT/PLAN:   Carrie Knight is a 79 year old female who presents with dizziness, N/V in the context of recent hospitalization for afib, hyperkalemia, and hypomag. Differential diagnosis includes afib, electrolyte abnormality, UTI vs intraabdominal infection, CVA. Will obtain UA, CMP, CBC, trop, lipase, CXR, CTH, CT A/P.    #Dizziness. Lasted for 5 days. Exacerbated when pt stands up. Etiology is likely hypovolemia due to poor PO intake and prior h/o hypovolemic dizziness during admission last week. Low concern for neurological cause due to abnormal Ct Head and pt's intact  mental status.  -Check orthostatics  -Continue 1L LR boluses as needed  -Monitor I&Os    #Paroxysmal Afib w/ RVR.   -Monitor on tele  -Metoprolol tartrate 26m BID  -Apixaban 540mBID    #Asymptomatic UTI and Cystitis. After hysterectomy, pt has had urinary catheter for 5 years. Pt has h/o ESBL Urine Cx. Pt reports no pain during urination/defecation. CTAP showed bladder wall thickening and perivesical stranding. UA pos for nitrites.   -Cont zosyn  -Continue 1L LR boluses as needed  -F/U Urine cx    #Hypomagnesemia  -Replete as needed    #DM  -Basal/Bolus    #Anemia  -Ferrous sulfate 325 qd    CODE STATUS: Full Code    MiSarita BottomMD. PGY-1.

## 2020-05-07 NOTE — ED Notes (Signed)
Pt eating dinner tray comfortably in gurney at this time. Pt denies any symptoms such as chest pain, palpitations, dizziness or headache. Pt awake and alert and does not appear in any distress. Metoprolol 25mg  to be given at 2100 for BP. Will give medication and re-assess.

## 2020-05-07 NOTE — ED Notes (Signed)
Bed: EDA-18  Expected date: 05/07/20  Expected time: 10:54 PM  Means of arrival:   Comments:  [place] Bed 2

## 2020-05-07 NOTE — ED EKG Interpretation (Signed)
ED EKG Interpretation    EKG: Atrial Fibrillation with Normal Axis and no ischemic changes.

## 2020-05-07 NOTE — Interdisciplinary (Addendum)
9/24  CM s/w Jarrett Soho at [place] [phone]). Patient will be transferred to [facility] Bed 1. Accepting MD is Dr. Rona Ravens. Phone number for  Report is [phone]. [facility] ETA is 6pm. CD obtained. Patient transfer explained to patient via interpretor [phone]. Transfer form signed by patient and MD. Transfer packet given to bedside nurse Montel Culver.     6:00pm Bedside nurse requested CM s/w [place]. [place] wanted to know when patient was coming. CM informed [place] that [place] at [place] had assigned patient to [place] and patient was to transfer there.   [place] will cancel the tele bed they were holding for patient.     6:05pm Bedside nurse Deon Pilling stated he called [facility] and they are no longer accepting the patient. CM then called [facility] after hours and s/w Joelene Millin. Maudie Mercury took the information and will look into it.     6:07pm CM received a call from [place] at [place]. She informed CM that [facility] stated they didn't realize patient on contact isolation, and now they cannot take the patient. Jarrett Soho will see if patient can transfer to [place]. CM stated that we have been working on this patient for several hours and inquired if patient could be authorized for [facility]. Jarrett Soho gave verbal auth to admit to [facility].   CM notified Ruben at [facility] after hours ([phone]) that Jarrett Soho had given verbal auth to admit to [facility].     CM notified bedside nurse and Dr. Leilani Able that patient will stay at [facility].

## 2020-05-07 NOTE — ED Notes (Signed)
Pt resting comfortably in gurney at this time. No apparent distress noted nor reported. Will continue to monitor.

## 2020-05-07 NOTE — ED Notes (Signed)
Airway patent. Breathing even, unlabored and spontaneous at this time. Pt non-diaphoretic and appears non-toxic appearing. Pt A&O x4 and coherent. No apparent distress noted nor reported. Pt face even and symmetrical. No head trauma noted; no lacerations abrasions or contusions. No facial trauma noted nor focal deficits known. Pt denies any chest pain or palpitations. Lungs clear bilaterally and pt does not appear to be displaying an increased work of breathing. Pt is continent and able to self-perform ADL's. No GI/GU symptoms reported to this narrator at this time. Pt capable of performing active ROM in all four extremities without restriction / limitations. Pulses strong, +2 quadrilaterally. No cyanosis or pallor demonstrated. Pt warm, pink and well-perfused. Pt able to walk independently without assistive devices.  Pt updated on plan of care; pt verbalized comprehensive understanding. Will continue to monitor pt for any changes in condition.

## 2020-05-07 NOTE — Discharge Instructions (Addendum)
Hipomagnesemia  Hypomagnesemia  La hipomagnesemia es una afeccin que se caracteriza por la baja concentracin de magnesio en la sangre. El magnesio es un mineral que se encuentra en muchos alimentos e interviene en muchos procesos diferentes del organismo. La hipomagnesemia puede afectar todos los rganos del cuerpo y en casos graves, puede causar problemas potencialmente mortales.  Cules son las causas?  Esta afeccin puede ser causada por lo siguiente:   Magnesio insuficiente en la dieta.   Desnutricin.   Problemas de absorcin del magnesio a nivel de los intestinos.   Deshidratacin.   El consumo excesivo de alcohol.   Vmitos.   Diarrea grave o crnica.   Algunos medicamentos, incluidos los medicamentos que hacen orinar ms (diurticos).   Ciertas enfermedades, por ejemplo, enfermedad renal, diabetes, enfermedad celaca e hiperactividad de la glndula tiroidea.  Cules son los signos o los sntomas?  Los sntomas de esta afeccin [employer] siguientes:   Prdida del apetito.   Nuseas y vmitos.   Sacudidas o temblores involuntarios de una parte del cuerpo.   Debilidad muscular.   Hormigueos en las manos y las piernas.   Rigidez muscular repentina (espasmos musculares).   Confusin.   Problemas psiquitricos, como depresin, irritabilidad o psicosis.   Sensacin de Tour manager.   Convulsiones.  Estos sntomas son ms intensos si las concentraciones de calcio bajan de [place] repentina.  Cmo se diagnostica?  Esta afeccin se puede diagnosticar en funcin de lo siguiente:   Los sntomas y antecedentes mdicos.   Un examen fsico.   Anlisis de sangre y [place].  Cmo se trata?  El tratamiento depende de la causa y de la gravedad de Engineer, manufacturing systems. Puede tratarse de las siguientes maneras:   Un suplemento de magnesio que puede tomarse en comprimidos. Si la afeccin es grave, el magnesio por lo general se administra por va intravenosa.   Cambios en la dieta. Pueden indicarle que  consuma alimentos con alto contenido de [place], como verduras de [place], guisantes, frijoles y frutos secos.   Suspender el consumo de alcohol.  Siga estas indicaciones en su casa:          Asegrese de incorporar a la dieta alimentos que contengan magnesio. Los alimentos con alto contenido de magnesio incluyen:  ? Verduras de hoja verde, como espinaca y [place].  ? Frijoles y guisantes.  ? Frutos secos y semillas, como almendras y semillas de [place].  ? Cereales integrales, como el pan integral y cereales fortificados.   Tome suplementos de magnesio si el mdico se lo indica. Tmelos como se le indic.   Tome los medicamentos de venta libre y los recetados solamente como se lo haya indicado el mdico.   Hgase controlar los niveles de magnesio como se lo haya indicado el mdico.   Cuando haga [place], tome lquidos que Equities trader.   Evite el consumo de alcohol.   Concurra a todas las visitas de control como se lo haya indicado el mdico. Esto es importante.  Comunquese con un mdico si:   Empeora en lugar de mejorar.   Los sntomas vuelven a Arts administrator.  Solicite ayuda inmediatamente si:   Presenta debilidad muscular grave.   Tiene problemas para respirar.   Siente que el corazn late acelerado.  Resumen   La hipomagnesemia es una afeccin que se caracteriza por la baja concentracin de magnesio en la [place].   La hipomagnesemia puede afectar todos los rganos del cuerpo.   El tratamiento puede incluir comer ms alimentos que  contengan magnesio, tomar suplementos de magnesio y no beber alcohol.   Hgase controlar los niveles de magnesio como se lo haya indicado el mdico.  Esta informacin no tiene Marine scientist el consejo del mdico. Asegrese de hacerle al mdico cualquier pregunta que tenga.  Document Released: 07/13/2008 Document Revised: 09/20/2017 Document Reviewed: 09/20/2017  Elsevier Interactive Patient Education  2019 Elsevier Inc.                Anemia por  deficiencia de hierro en adultos  Iron Deficiency Anemia, Adult  La anemia por deficiencia de hierro es Engineering geologist en la que la concentracin de glbulos rojos o hemoglobina en la sangre est por debajo de lo normal debido a la falta de hierro. La hemoglobina es la sustancia de los glbulos rojos que lleva el oxgeno a todos los tejidos del cuerpo. Cuando la concentracin de los glbulos rojos o de la hemoglobina es demasiado baja, no llega suficiente oxgeno a estos tejidos.  La anemia por deficiencia de hierro, generalmente, es de larga duracin (crnica) y se desarrolla con el tiempo. Puede causar sntomas o no. ste es un tipo comn de anemia.  Cules son las causas?  Esta afeccin puede ser causada por lo siguiente:   No hay suficiente hierro en la dieta.   Prdida de sangre a causa de una hemorragia en el intestino.   Prdida de sangre por una afeccin gastrointestinal, como la enfermedad de Crohn.   Extraccin de Biochemist, clinical con frecuencia, como en donaciones de [place].   Absorcin intestinal anormal.   Perodos menstruales muy abundantes en las mujeres.   Cncer en el aparato digestivo, como cncer de colon.  Cules son los signos o los sntomas?  Los sntomas de esta afeccin pueden incluir los siguientes:   [place].   Dolor de [place].   Piel, labios y uas plidos.   Prdida del apetito.   Debilidad.   Falta de aire.   Mareos.   Manos y pies fros.   Latidos cardacos rpidos o irregulares.   Irritabilidad. Es ms frecuente en los casos de anemia grave.   Respiracin rpida. Es ms frecuente en los casos de anemia grave.  Los casos leves de anemia podran no causar sntomas.  Cmo se diagnostica?  Esta afeccin se diagnostica en funcin de lo siguiente:   Sus antecedentes mdicos.   Un examen fsico.   Anlisis de sangre.  Es posible que le realicen otros estudios para Pension scheme manager la causa subyacente de la anemia; por ejemplo:   Anlisis para Hydrographic surveyor la presencia de [employer] heces  (anlisis de sangre oculta en heces).   Un procedimiento para examinar el interior del colon y el recto (colonoscopia).   Un procedimiento para examinar el interior del esfago y Product manager (endoscopia).   Un estudio en el que se toman clulas de la mdula sea (aspiracin de mdula sea) o se extrae lquido de la mdula sea para examinarlos (biopsia). Esto debe realizarse en contadas ocasiones.  Cmo se trata?  Esta afeccin se trata remediando la causa de la deficiencia de hierro. El tratamiento puede incluir lo siguiente:   Agregarle a la dieta alimentos ricos en hierro.   Tomar suplementos de hierro. Si est embarazada o amamantando, es posible que deba tomar una dosis adicional de hierro, ya que su dieta normal generalmente no proporciona la cantidad necesaria.   Aumentar la ingesta de vitaminaC. La vitamina C ayuda al organismo a Research scientist (physical sciences). Es posible que el [employer]  recomiende tomar los suplementos de hierro con un vaso de jugo de naranja o con un suplemento de vitaminaC.   Medicamentos para disminuir el flujo menstrual abundante.   [place].  Es posible que deba realizarse varios anlisis de sangre para determinar si el tratamiento da resultado. Segn cul sea la causa preexistente, la anemia debera corregirse en un lapso de 28meses desde el comienzo del [place]. Si el tratamiento no funciona, es posible que deba realizarse otros estudios.  Siga estas indicaciones en su casa:  Medicamentos   [employer] de venta libre y los recetados solamente como se lo haya indicado el mdico. Esto incluye vitaminas y suplementos de hierro.   Si no tolera tomar los suplementos de hierro por boca, hable con su mdico sobre la posibilidad de administrarlos por una vena (va intravenosa) o mediante una inyeccin en un msculo.   Para lograr una mejor absorcin del hierro, los suplementos de hierro se deben tomar con el estmago vaco. Si no los tolera con el estmago vaco, posiblemente  deba tomarlos con la comida.   No beba leche ni tome anticidos al [employer] suplementos de hierro. La leche y los anticidos podran interferir en la absorcin del hierro.   Los suplementos de hierro pueden Recruitment consultant. Para evitar el estreimiento, incluya fibra en su dieta como se lo haya indicado el mdico. Posiblemente tambin le recomienden tomar un laxante.  Comida y bebida     Hable con el mdico antes de cambiar la dieta. Podra recomendarle que consuma alimentos que contienen mucho hierro; por ejemplo:  ? Hgado.  ? Carne de res con bajo contenido de grasa Kara Pacer).  ? Panes y cereales con hierro agregado (fortificados).  ? Huevos.  ? Frutas pasas.  ? Verduras de [employer] oscuro.   Para ayudar al organismo a aprovechar el hierro de los alimentos con alto contenido de hierro, Control and instrumentation engineer dichos alimentos al mismo tiempo que frutas y verduras frescas que tengan mucha vitaminaC. Algunos alimentos ricos en [place] son los siguientes:  ? Naranjas.  ? Pimientos.  ? Tomates.  ? Mangos.   Beba suficiente lquido como para mantener la orina clara o de color amarillo plido.  Instrucciones generales   Retome sus actividades normales como se lo haya indicado el mdico. Pregntele al mdico qu actividades son seguras para usted.   Mantenga una buena higiene. La anemia puede hacerlo ms propenso a contraer enfermedades e infecciones.   Concurra a todas las visitas de control como se lo haya indicado el mdico. Esto es importante.  Comunquese con un mdico si:   Siente nuseas o vomita.   Se siente dbil.   Comienza a sudar sin motivo.   Presenta sntomas de estreimiento; por ejemplo:  ? Defecar menos de tres veces por semana.  ? Esforzarse mucho para defecar.  ? Tener las heces secas y duras, o ms grandes que lo normal.  ? Sensacin de estar lleno o hinchado.  ? Dolor en la parte baja del abdomen.  ? No sentir alivio despus de defecar.  Solicite ayuda de inmediato si:   Se  desmaya. Si esto sucede, no conduzca por sus propios medios [employer] Financial. Comunquese con el servicio de emergencias de su localidad (911 en los [place]).   Siente dolor en el pecho.   Le falta el aire con las siguientes caractersticas:  ? Es intensa.  ? Empeora con la actividad fsica.   Tiene latidos cardacos acelerados.   Se siente  mareado cuando se para despus de estar sentado o acostado.  Esta informacin no tiene Marine scientist el consejo del mdico. Asegrese de hacerle al mdico cualquier pregunta que tenga.  Document Released: 07/31/2005 Document Revised: 05/03/2017 Document Reviewed: 01/02/2016  Elsevier Interactive Patient Education  2019 [employer].

## 2020-05-07 NOTE — ED Notes (Signed)
Handoff done at this time. Pt stable for care handoff to [place], [place]

## 2020-05-08 LAB — GLUCOSE, POINT OF CARE
Glucose, Point of Care: 179 MG/DL — ABNORMAL HIGH (ref 70–125)
Glucose, Point of Care: 223 MG/DL — ABNORMAL HIGH (ref 70–125)
Glucose, Point of Care: 80 MG/DL (ref 70–125)
Glucose, Point of Care: 280 MG/DL — ABNORMAL HIGH (ref 70–125)

## 2020-05-08 LAB — BASIC METABOLIC PANEL, BLOOD
BUN: 13 mg/dL (ref 7–25)
CO2: 25 mmol/L (ref 21–31)
Calcium: 8.8 mg/dL (ref 8.6–10.3)
Chloride: 103 mmol/L (ref 98–107)
Creat: 1.1 mg/dL (ref 0.6–1.2)
Electrolyte Balance: 6 mmol/L (ref 2–12)
Glucose: 79 mg/dL — ABNORMAL LOW (ref 85–125)
Potassium: 4.3 mmol/L (ref 3.5–5.1)
Sodium: 134 mmol/L — ABNORMAL LOW (ref 136–145)
eGFR - high estimate: 58 — ABNORMAL LOW (ref >59)
eGFR - low estimate: 48 — ABNORMAL LOW (ref >59)

## 2020-05-08 LAB — CBC WITH DIFF, BLOOD
ANC automated: 5.2 10*3/uL (ref 2.0–8.1)
Basophils %: 0.3 %
Basophils Absolute: 0 10*3/uL (ref 0.0–0.2)
Eosinophils %: 2.2 %
Eosinophils Absolute: 0.2 10*3/uL (ref 0.0–0.5)
Hematocrit: 24 % — ABNORMAL LOW (ref 34.0–44.0)
Hgb: 8.3 G/DL — ABNORMAL LOW (ref 11.5–15.0)
Lymphocytes %: 15.4 %
Lymphocytes Absolute: 1.1 10*3/uL (ref 0.9–3.3)
MCH: 30.3 PG (ref 27.0–33.5)
MCHC: 34.7 G/DL (ref 32.0–35.5)
MCV: 87.2 FL (ref 81.5–97.0)
MPV: 7.5 FL (ref 7.2–11.7)
Monocytes %: 8.4 %
Monocytes Absolute: 0.6 10*3/uL (ref 0.0–0.8)
Neutrophils % (A): 73.7 %
PLT Count: 265 10*3/uL (ref 150–400)
RBC: 2.75 10*6/uL — ABNORMAL LOW (ref 3.70–5.00)
RDW-CV: 15.1 % — ABNORMAL HIGH (ref 11.6–14.4)
White Bld Cell Count: 7.1 10*3/uL (ref 4.0–10.5)

## 2020-05-08 LAB — MAGNESIUM, BLOOD: Magnesium: 2 mg/dL (ref 1.9–2.7)

## 2020-05-08 LAB — PHOSPHORUS, BLOOD: Phosphorus: 3.9 MG/DL (ref 2.5–5.0)

## 2020-05-08 MED ORDER — METOPROLOL TARTRATE 25 MG OR TABS
37.5000 mg | ORAL_TABLET | Freq: Two times a day (BID) | ORAL | Status: DC
Start: 2020-05-08 — End: 2020-05-08

## 2020-05-08 MED ORDER — MAGNESIUM OXIDE 400 MG OR TABS
800.0000 mg | ORAL_TABLET | Freq: Every day | ORAL | Status: DC
Start: 2020-05-08 — End: 2020-05-09
  Administered 2020-05-08 – 2020-05-09 (×3): 800 mg via ORAL
  Filled 2020-05-08 (×2): qty 2

## 2020-05-08 MED ORDER — FOLIC ACID 1 MG OR TABS
1.0000 mg | ORAL_TABLET | Freq: Every day | ORAL | Status: DC
Start: 2020-05-08 — End: 2020-05-09
  Administered 2020-05-08 – 2020-05-09 (×3): 1 mg via ORAL
  Filled 2020-05-08 (×2): qty 1

## 2020-05-08 MED ORDER — METOPROLOL TARTRATE 50 MG OR TABS
50.0000 mg | ORAL_TABLET | Freq: Two times a day (BID) | ORAL | Status: DC
Start: 2020-05-08 — End: 2020-05-09
  Administered 2020-05-08: 50 mg via ORAL
  Filled 2020-05-08: qty 1

## 2020-05-08 MED ORDER — METOPROLOL TARTRATE 12.5 MG OR TABS (HALF TABLET)
12.5000 mg | Freq: Once | Status: AC
Start: 2020-05-08 — End: 2020-05-08
  Administered 2020-05-08 (×2): 12.5 mg via ORAL
  Filled 2020-05-08: qty 1

## 2020-05-08 MED ORDER — VITAMIN B-12 (CYANOCOBALAMIN) 50 MCG (HALF TABLET)(~~LOC~~)
50.0000 ug | Freq: Every day | Status: DC
Start: 2020-05-08 — End: 2020-05-09
  Administered 2020-05-08 – 2020-05-09 (×2): 50 ug via ORAL
  Filled 2020-05-08 (×2): qty 1

## 2020-05-08 MED ORDER — METOPROLOL TARTRATE 12.5 MG OR TABS (HALF TABLET)
12.5000 mg | Freq: Once | Status: AC
Start: 2020-05-08 — End: 2020-05-08
  Administered 2020-05-08: 12.5 mg via ORAL
  Filled 2020-05-08: qty 1

## 2020-05-08 NOTE — Interdisciplinary (Signed)
Physical Therapy Evaluation and Discharge    Admitting Physician:  Eather Colas, *  Admission Date 05/07/2020    Inpatient Diagnosis:   Problem List       Codes    Hypomagnesemia    -  Primary ICD-10-CM: E83.42  ICD-9-CM: 275.2    Lightheaded     ICD-10-CM: R42  ICD-9-CM: 780.4    Urinary tract infection associated with indwelling urethral catheter, subsequent encounter     ICD-10-CM: T83.511D, N39.0  ICD-9-CM: V58.89, 996.64, 599.0    Decreased functional mobility     ICD-10-CM: R26.89  ICD-9-CM: 781.99          IP Start of Service   Start of Care: 05/08/20  Onset Date: 05/07/2020  Reason for referral: Activity tolerance limitation    Preferred Language:Spanish    Interpreter Services  Interpreter Services-Rehab: In person    Past Medical History:   Diagnosis Date    Diabetes mellitus (CMS-HCC)     Heart palpitations     Hypertension       Past Surgical History:   Procedure Laterality Date    BLADDER SURGERY      HYSTERECTOMY         PT Acute     Row Name 05/08/20 1400          Type of Visit    Type of Physical Therapy note  Physical Therapy Evaluation and Discharge     Row Name 05/08/20 1400          Treatment Precautions/Restrictions    Precautions/Restrictions  Fall;Isolation;Multiple lines     Fall  Socks/charm     Other Precautions/Restrictions Information  contact isolation, IV, telemetry     [place] Name 05/08/20 1400          Medical History    History of presenting condition  Per H&P: "MAIRIM BADE is a 79 year old female with PMH of HTN, DM2, anemia, recurrent UTIs, and 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 Patient was recently hospitalized 9/19-9/21 for chief complaint of dizziness and diarrhea, found to have hyperkalmia, hypomagnesemia, and afib with RVR. She received PO metoprolol for rate control and dizziness was resolved on discharge, discharged with eliquis and  metoprolol tartrate. Pt reports dizziness started again the morning after discharge, feels as if the room is spinning, symptoms do not change with eyes opened/closed. Dizziness worsens when pt stands up and she denies muscle weakness in her extremities. Nausea started this morning with yellow, non-bloody emesis. Patient has an indwelling urinary catheter which was last changed earlier this week. Her urinary catheter was placed 5 years ago and pt has not urinated without it since its placement. Also reports mild diffuse abdominal pain. No recent falls or trauma. Denies headache, chest pain, sensation of palpitations, SOB, diarrhea."     Fall history  Falls reported in the last 6 months;Discussed fall prevention education with patient     [place] Name 05/08/20 1400          Functional History    Prior Level of Function  No deficits;Minimal deficits     Equipment required for mobility in the home  Walker;Cane quad cane, [id number]     Other Functional History Information  Patient reports being independent with functional mobility while using a cane.     [place] Name 05/08/20 1400          Social  History    Living Situation  Lives with parent/family daughter and spouse     [place] activities of daily living (ADL's) on one level;Stairs present     Number of steps to enter home  4 R handrail     Number of steps within home  0     Other Social History Information  Patient has numerous family members who can assist as needed.     [place] Name 05/08/20 1400          Subjective    Subjective Information  Patient received supine in bed, agreeable to PT. "I can dance"     Patient status  Patient agreeable to treatment;Nursing in agreement for treatment;Patient pain control adequate to participate in therapy     [place] Name 05/08/20 1400          Pain Assessment    Pain Asssessment Tool  Numeric Pain Rating Scale     Row Name 05/08/20 1400          Numeric Pain Rating Scale    Pain  Intensity - rating at present  0     Pain Intensity- rating after treatment  0     Row Name 05/08/20 1400          Objective    Overall Cognitive Status  Intact - no cognitive limitations or impairments noted     Communication  No communication limitations or impairments noted. Current status of hearing, speech and vision allow functional communication.     Coordination/Motor control  No limitations or impairments noted. Movement patterns are fluid and coordinated throughout     Balance  Balance limitations present     Other Balance Information  Patient with one instance of leaning to L during walk, able to correct with min A.      Other  Extremity Assessment  Information  Patient with WFL AROM, gross strength: 4/5, no sensory loss.     Functional Mobility  Functional mobility deficits present     Bed Mobility  Supervised     Bed Mobility Comments  to get up to R side of bed.     Transfers to/from [employer] Comments  from bed to [facility]     Gait  Supervised     Gait Comments  Patient able to ambulate with FWW, no loss of balance. Decreased stride length, needs some guidance to avoid obstacles in busy hospital unit.      Device used for [employer]  150     Other Objective Findings  Patient with steady vitals with position change, no c/o dizziness. patient able to ambulate with FWW around unti with supervision. patient left sitting up in bed, needs in reach, RN [facility] provided handoff.                Eval cont.     [place] Name 05/08/20 1400          [place] AM-PAC: Basic Mobility    Assistance Needed to Turn from Back to Side While in a Flat Bed Without Using Bedrails  4 - None (independent)     Difficulty with Supine to Sit Transfer  4 - None (independent)     How Much Help Needed to Move to/from Bed to Chair  3 - A little (supervised/min assist)  Difficulty with Sit to Stand Transfer from Chair with Arms  3 - A little (supervised/min  assist)     How Much Help Needed to Walk in Room  3 - A little (supervised/min assist)     How Much Help Needed to Climb 3-5 Steps with a Rail  3 - A little (supervised/min assist)     AMPAC Total Score  20     Assessment: AM-PAC Basic Mobility Impairment Rating  Score 19-22 - 20-39% impaired     Row Name 05/08/20 1400          Patient/Family Education    Learner(s)  Patient     Learner response to rehab patient education interventions  Verbalizes understanding;Able to return demonstrate teaching     Patient/family training comments  Patient agreeable to education, agrees with home health plan.     [place] Name 05/08/20 1400          Assessment    Assessment  Sparkles Mcneely is a 79 year old female admitted with dizziness and diarrhea, h/o a. fib. patient reports being close to baseline with functonal mobility, is able to complete bed mobility with independence - supervision, transfers and gait with FWW and supervision. Patient has adequate assist at home to provide around the clock care and assist as needed. patient wil lbenefit from home PT to increase safety and independence within her home surroundings. Patient is encouraged to complete progressive mobility per nursing protocols. Patient has no acute care skilled PT needs at this time and will be discharged from PT services.     Rehab Potential  Good     Row Name 05/08/20 1400          Patient stated Goal    Patient stated goal  to get better / go home.     [place] Name 05/08/20 1400          Treatment Plan Disussion    Treatment Plan Discussion and Agreement  Patient/family/caregiver stated understanding and agreement with the therapy plan;No family/caregiver available;Patient support system determined and all questions were asked and answered     [place] Name 05/08/20 1400          Treatment Plan    Frequency of treatment  Patient appropriate for discharge from therapy     Duration of treatment (number of visits)  One time only, further treatment not indicated     Status  of treatment  One time only treatment, further skilled therapy not indicated     [place] Name 05/08/20 1400          Patient Safety Considerations    Patient safety considerations  Call light left in reach and fall precautions in place;Patient left  in appropriate pressure relieving position;Patient may be at risk for falls;Nursing notified of safety considerations at end of treatment;Patient returned to bed at end of treatment RN [facility]     Patient assistive device requirements for safe ambulation  Walker staff assist     Other Patient Safety Considerations Information  fall risk,  dizziness     Row Name 05/08/20 1400          Therapy Plan Communication    Therapy Plan Communication  Discussed therapy plan with Nursing and/or Physician;Encouraged out of bed with assistance by     Encouraged out of bed with assistance by  Nursing     Row Name 05/08/20 1400          Physical Therapy Patient Discharge Instructions  Your Physical Therapist suggests the following  Continue to complete your home exercise program daily as instructed;Continue to follow your prescribed mobility precautions when moving in and out of bed and walking  as instructed;Continue to use correct body mechanics when moving in and out of bed as instructed;Supervision with walking is suggested for increased safety;Continue to use your assistive device as instructed when walking to improve your stability and prevent falls     Row Name 05/08/20 1400          Type of Eval    Moderate Complexity [phone])  Completed     Row Name 05/08/20 1400          Therapeutic Procedures    Gait Training [phone])  Assistive device training;Dynamic activities while walking;Gait pattern analysis and treatment of deviations;Patient education;Postural alighnment/biomechanic training during gait;Weight shift and postural control activities during gait        Total TIMED Treatment (min)   15     Row Name 05/08/20 1400          Treatment Time     Total TIMED Treatment  (min)  30      Total Treatment Time (min)  60     Treatment start time  1025         Post Acute Discharge Recommendations  Discharge Rehabilitation Reccomendations [facility] ONLY): Would benefit from intermittent Physical therapy post acute discharge to maximize functional independence;1-3 times per week;Home safety  Equipment recommendations: Gilford Rile (front wheeled walker)  Walker Justification: Patient safety and mobility is enhanced by the use of a walker. Patient has indicated agreement to utilize the walker during Mobility Related Activities of Daily Living (MRADLs) and is able to complete MRADLs in a more timely manner using a walker.    The physical therapist of record is endorsed by evaluating physical therapist.

## 2020-05-08 NOTE — Discharge Summary (Signed)
DISCHARGE SUMMARY     Patient Name:  Carrie Knight    Date of Admission:  05/07/2020  Date of Discharge:  05/09/2020    Recomendaciones de Seguimiento:  - Aumente el metoprolol (medicamento para el corazn) de 25 mg [employer] al da a 75 mg [employer].  - Tome hierro, vitamina B12 (cianocobalamina), cido flico y [employer].   - Bonnita Nasuti de 2 litros de [place]. Asegrese de comer tres veces al da ya que sus niveles de hierro, vitaminas y electrolitos son bajos.  - Siga su rgimen de medicacin como se detalla a continuacin.  - Haga un seguimiento con su PCP en 1-2 semanas.  - Si experimenta algn sntoma nuevo o que empeora, informe al departamento de emergencias ms cercano o busque atencin mdica de inmediato.    Follow Up Recommendations:  - Please increase Metoprolol (heart medicine) from 25 mg twice a day to 75 mg twice a day.  - Please take iron, vitamin B12 (Cyanocobalamin), folic acid, and magnesium every day.  - Please drink 1.5 to 2L of fluid every day. Make sure to eat three times a day as your iron, vitamin, and electrolyte levels are low.  - Please follow your medication regimen as detailed below  - Please follow-up with your PCP in 1-2 weeks  - Should you experience any new or worsening symptoms, please report to the nearest emergency department or seek medical attention immedately.      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       Procedures/Significant Imaging During This Hospitalization:  X-Ray Chest Single View  Result Date: 05/07/2020  Single portable semiupright frontal view of the chest was obtained. FINDINGS/ There is no focal pulmonary infiltrate or consolidation.There is no pneumothorax. There is no large pleural effusion. The cardiomediastinal silhouette is unchanged. The pulmonary vasculature is unremarkable. There is no acute osseous abnormality. No interval change compared to 05/02/2020.     X-Ray Chest Single View  Result Date: 05/02/2020  Cardiomediastinal silhouette is within normal limits. Pulmonary vasculature is within normal limits. There is no large pleural effusion. There is no focal consolidation. There is no pneumothorax. Limited evaluation of the partially visualized osseous structures is grossly unremarkable.    CT Head W/O Contrast  Result Date: 05/07/2020  1. No acute intracranial abnormality.   2. Chronic findings, as above.    CT Abdomen And Pelvis With Contrast  Result Date: 05/07/2020  1.  Suspect cystitis as evidenced with bladder wall thickening and perivesical stranding.   2.  Suspect infectious or inflammatory enterocolitis as evidenced with wall thickening of small bowel loops in the pelvis and mild pancolonic wall thickening. No bowel obstruction, pneumoperitoneum, or drainable walled off collection.      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 Patient was recently hospitalized 9/19-9/21 for chief complaint of dizziness and diarrhea, found to have hyperkalmia, hypomagnesemia, and afib with RVR. She received PO metoprolol for rate control and dizziness was resolved on discharge,  discharged with eliquis and metoprolol tartrate.      Pt reports dizziness started again the morning after discharge, feels as if the room is spinning, symptoms do not change with eyes opened/closed. Dizziness worsens when pt stands up and she denies muscle weakness in her extremities. Nausea started this morning with yellow, non-bloody emesis. Patient has an indwelling urinary catheter which was last changed earlier this week. Her urinary catheter was placed 5 years ago and pt has not urinated without it since its placement. Also reports mild diffuse abdominal pain. No recent falls or trauma. Denies headache, chest pain, sensation of palpitations, SOB, diarrhea.    Hospital Course:  Patient was admitted for further workup and found to be hypomagnesemia, so magnesium was repleted. CT abdomen and pelvis showed suspected cystitis and urine cultures from 9/19 grew E. Coli and Klebsiella, but patient was without urinary symptoms or hematuria. Blood culture was without growth to date. ID was consulted for recommendation and management, and decision was made against treating the asymptomatic bacteriuria. Patient was also found to be hypertensive with systolic BP to the 324M-010U and tachycardic, so metoprolol was increased to 37.5 mg BID, then again to 50 mg BID, then again to 75 mg BID for tighter control of heart rate, as this was likely contributing to patient's initial dizziness. On the day of discharge, patient was stable with controlled heart rate even with ambulation and asymptomatic. Ferrous sulfate, magnesium sulfate, vitamin V25, and folic acid were started for electrolyte imbalance and anemia. Pt encouraged to follow up with outside GI for colonoscopy, which had been scheduled last week but missed due to hospital admission, for iron-deficiency anemia.       Tests Outstanding at Discharge Requiring Follow Up: None    Discharge Condition: Improved.    Key Physical Exam Findings at Discharge:  Temperature:  [97.7  F (36.5 C)-98.6 F (37 C)] 98.1 F (36.7 C) (09/26 1605)  Blood pressure (BP): (124-160)/(54-118) 137/72 (09/26 1605)  Heart Rate:  [77-112] 88 (09/26 1605)  Respirations:  [18-21] 18 (09/26 1605)  Pain Score: 0 (09/26 1605)  O2 Device: None (Room air) (09/26 1605)  SpO2:  [97 %-100 %] 98 % (09/26 1605)    Gen: In no acute distress. A&Ox4  Lungs:  CTA (B)     Heart:  irregular, S1 S2, no murmurs  Abdomen:  soft, non-tender, normoactive bowel sounds  Extremities:  2+ pulses, no edema    Recent Labs     05/07/20  0654 05/08/20  0433 05/09/20  0545   WBCCOUNT 9.8 7.1 6.7   HGB 9.9* 8.3* 8.2*   MCV 87.6 87.2 87.1     Recent Labs     05/07/20  0654 05/07/20  0654 05/07/20  1927 05/08/20  0433 05/09/20  0545   SODIUM 133*  --   --  134* 136   K 4.6  --   --  4.3 4.6   CO2 21  --   --  25 24   CL 102  --   --  103 104   BUN 15  --   --  13 11   CREAT 1.1  --   --  1.1 0.9   GLU 215*  --   --  79* 106   [place] 9.2  --   --  8.8 8.5*   MG 1.4*   < > 2.3 2.0 1.8*   PHOS 3.0  --   --  3.9 3.3    < > = values in this interval not displayed.     Recent Labs     05/07/20  0654   TBILI 0.5   ALK 90   AST 15   ALT 18   ALB 4.3     Recent Labs     05/07/20  0654   PROTIME 17.0*   PTT 37.1*   INR 1.47*     No results for input(s): TROPI, TCPK, CKMB, CKIND in the last 72 hours.    Discharge Diet:  Diet Order Therapeutic; Consistent Carb (Diabetic); Mod 180-225gm (1600-1900 cal)    Discharge Medications:     What To Do With Your Medications        START taking these medications        Add'l Info   Cyanocobalamin 50 MCG Tabs  Tome una tableta (50 mcg) por va oral diariamente.  (Take 1 tablet (50 mcg) by mouth daily.)   Quantity: 30 tablet  Refills: 3     folic acid 1 MG tablet  Commonly known as: FOLVITE  Tome una tableta (1 mg) por va oral diariamente.  (Take 1 tablet (1 mg) by mouth daily.)   Quantity: 90 tablet  Refills: 3     Magnesium Oxide 400 (241.3 Mg) MG Tabs tablet  Take 2 tablets (800 mg) by mouth daily.   Quantity: 90  tablet  Refills: 3            CHANGE how you take these medications        Add'l Info   metoprolol tartrate 25 MG tablet  Commonly known as: LOPRESSOR  Take 3 tablets (75 mg) by mouth in the morning and at bedtime.   Quantity: 180 tablet  Refills: 3  What changed: how much to take            CONTINUE taking these medications        Add'l Info   Eliquis 5 MG  Tome una tableta (5 mg) por va oral cada 12 horas.  (Take 1 tablet (5 mg) by mouth every 12 hours.)  Generic drug: apixaban   Quantity: 60 tablet  Refills: 1     insulin glargine 100 UNIT/ML injection  Commonly known as: LANTUS  Inject 34 Units under the skin daily.   Quantity: 10 mL  Refills: 5     IRON PO  Patient does not know strength, reports taking 1-2 times/day   Refills: 0     loperamide 2 MG capsule  Commonly known as: IMODIUM  Take 2 mg by mouth as needed for Diarrhea. Maximum 8 capsules per day   Refills: 0     metFORMIN 1000 MG tablet  Commonly known as: GLUCOPHAGE  Take 1 tablet (1,000 mg) by mouth 2 times daily.   Quantity: 180 tablet  Refills: 3            STOP taking these medications      SPS 15 GM/60ML suspension  Generic drug: sodium polystyrene  Where to Get Your Medications        These medications were sent to [id number] [place] - [address] [place] [address]     Phone: [phone]  Cyanocobalamin 50 MCG Tabs  folic acid 1 MG tablet  Magnesium Oxide 400 (241.3 Mg) MG Tabs tablet  metoprolol tartrate 25 MG tablet         Allergies:No Known Allergies    MDRO Status: Negative    Discharge Disposition:  Home with home care services:  home physical therapy    Discharge Code Status:  Full code / full care  This code status is not changed from the time of admission.    Advance Directive on File?  No    For appointments requested for after discharge that have not yet been scheduled, refer to the Post Discharge Referrals section of the After Visit Summary.    Discharging P43 Contact Information:   Discharging Physician's Contact Information: [facility][phone]

## 2020-05-08 NOTE — Consults (Signed)
Infectious Disease E-Consult Note    Date:  05/08/20    HPI:  79 yo female with chronic foley admitted for dizziness and nausea.  UA is positive and CT with bladder wall thickening.  No fevers or chills.  No urinary symptoms.      Impression and Plan:      1.  Bladder wall thickening:  Patient does not have any urinary symptoms and her clinical symptoms are not consistent with urinary tract infection.  Recommend:    --  No antibiotics  --  Consider urologic evaluation on reason for chronic foley if not followed by urology    The recommendations provided in this eConsult are based on the clinical data available to me at the time, and are furnished without the benefit of a comprehensive in-person evaluation of the patient.  Any new clinical issues or changes in patient status since the filing of this eConsult will need to be taken into account when assessing these recommendations.  Please contact me if you have further questions.

## 2020-05-08 NOTE — Interdisciplinary (Signed)
Report given to Olu, RN.

## 2020-05-08 NOTE — Interdisciplinary (Signed)
Occupational Therapy Evaluation and Discharge    Admitting Physician:  Eather Colas, *  Admission Date 05/07/2020    Inpatient Diagnosis:   Problem List       Codes    Hypomagnesemia    -  Primary ICD-10-CM: E83.42  ICD-9-CM: 275.2    Lightheaded     ICD-10-CM: R42  ICD-9-CM: 780.4    Urinary tract infection associated with indwelling urethral catheter, subsequent encounter     ICD-10-CM: T83.511D, N39.0  ICD-9-CM: V58.89, 996.64, 599.0    Decreased functional mobility     ICD-10-CM: R26.89  ICD-9-CM: 781.99    Impaired mobility and ADLs     ICD-10-CM: Z74.09, Z78.9  ICD-9-CM: V49.89          IP Start of Service  Start of Care: 05/08/20  Reason for referral: Decline in performance of activities of daily living (ADL)    Preferred [place]  Interpreter Services-Rehab: Patient refused the use of a Producer, television/film/video ([place])    Past Medical History:   Diagnosis Date    Diabetes mellitus (CMS-HCC)     Heart palpitations     Hypertension       Past Surgical History:   Procedure Laterality Date    BLADDER SURGERY      HYSTERECTOMY         OT Acute     Row Name 05/08/20 1900          Type of Visit    Type of Occupational Therapy note  Occupational Therapy Evaluation and Discharge     [place] Name 05/08/20 1900          Treatment Time    Treatment Start Time  0930     Total TIMED Treatment (min)  30     Total Treatment Time (min)  42     Row Name 05/08/20 1900          Treatment Precautions/Restrictions    Precautions/Restrictions  Fall;Isolation     Fall  Socks/charm     Other Precautions/Restrictions Information  contact isolation, IV, tele     Row Name 05/08/20 1900          Medical History    History of presenting condition  Pt is a 79 year old female with PMH of HTN, DM2, anemia, recurrent UTIs, and 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      Fall  history  Falls reported in the last 6 months;Discussed fall prevention education with patient 2     Dominant Side  Right     Row Name 05/08/20 1900          Functional History    Prior Level of Function  No deficits     General ADL/Self-Care Assistance Needs  Independent with ADLs and self care using adaptive device/equipment     Equipment required for mobility in the home  Walker;Cane 4WW seat, quad cane     Other Functional History Information  Pt independent with ADL with 4WW seat     Row Name 05/08/20 1900          Social History    Living Situation  Lives with family     [place] accessibility  Stairs present     Number of steps to enter home  4     Number of  steps within home  0     Bathroom accessibility  Walk in shower     Other Social History Information  Pt's family able to assist upon D/C     Row Name 05/08/20 1900          Subjective    Patient status  Patient agreeable to treatment;Nursing in agreement for treatment     Row Name 05/08/20 1900          Pain Assessment    Pain Asssessment Tool  Numeric Pain Rating Scale     Row Name 05/08/20 1900          Numeric Pain Rating Scale    Pain Intensity - rating at present  0     Pain Intensity- rating after treatment  0     Row Name 05/08/20 1900          Activities of Daily Living (ADLs)    Self Feeding  Supervised     Self Grooming  Supervised     Upper Body Dressing  Supervised     Lower Body Dressing  Supervised     Bathing  Supervised     Toileting  Supervised     Toilet Transfers  Supervised     Row Name 05/08/20 1900          [place] AM-PAC: Daily Activity    Assistance Needed to Put on and Take off Regular Lower Body Clothing  3     Assistance Needed to Bathe, Including Washing, Rinsing, and Drying  3     Assistance Needed to Toilet Environmental manager, Bedpan, or Urinal)  3     Assistance Needed to Put on and Take off Regular Upper Body Clothing  4     Assistance Needed to Take Care of Personal Grooming Such as Brushing Teeth  4      Assistance Needed to Eat Meals  4     AM-PAC Daily Activity Total Score  21     AMP-PAC Daily Activity Impairment rating  Score 20-22 - 20-39% impaired     Row Name 05/08/20 1900          Objective    Overall Cognitive Status  Intact - no cognitive limitations or impairments noted     Communication  No communication limitations or impairments noted. Current status of hearing, speech and vision allow functional communication.     Coordination/Motor control  No limitations or impairments noted. Movement patterns are fluid and coordinated throughout     Balance  Balance limitations present     Static Sitting Balance  Good - able to maintain balance without handhold support, limited postural sway     Dynamic Sitting Balance  Good - accepts moderate challenge, able to maintain balance while picking object off floor     Static Standing Balance  Fair - able to maintain balance with handhold support, may require occasional minimal assistance     Dynamic Standing Balance  Fair - accepts minimal challenge, able to maintain balance while turning head/trunk     Extremity Assessment  Flexibility, strength, muscle tone and sensation grossly within functional limits throughout     Functional Mobility  Functional mobility deficits present     Bed Mobility  Supervised     Transfers to/from Stand  Supervised     Ambulation during functional tasks  Supervised     Device used for ambulation/mobility  Front wheeled walker     Ambulation Distance  household distances in hallway  Other Objective Findings  Pt is S overall for ADL and functional mobility tasks. Educated Pt on energy conservation and pacing with ADL.         OT Acute Tool Box     Row Name 05/08/20 1900          Cardiopulmonary    Cardiopulmonary Comments  BP 150s/60s RN aware     [place] Name 05/08/20 1900          Cognition Assessment    Overall Cognitive Status  Intact - no cognitive limitations or impairments noted             Eval cont.     [place] Name 05/08/20 1900           Patient/Family Education    Learner(s)  Patient     Learner response to rehab patient education interventions  Rosezena Sensor understanding     [place] Name 05/08/20 1900          Assessment    Assessment  Pt is S overall for ADL and functional mobility tasks. Pt is currently at baseline for ADL and functional mobility. Pt has good support from family upon D/C home. Discussed home safety and recommendations with Pt demonstrating understanding. Recommend use of shower chair for safety and to conserve energy. Pt will benefit from ongoing supervision from family for ADL tasks. Recommend HHOT and home safety eval to assess safety with performance of ADL in home setting. Pt is not in need of skilled OT services. D/C OT.      [place] Name 05/08/20 1900          Treatment Plan Disussion    Treatment Plan Discussion and Agreement  Patient support system determined and all questions were asked and answered;Patient/family/caregiver stated understanding and agreement with the therapy plan     Row Name 05/08/20 1900          Treatment Plan    Duration of treatment (number of visits)  One time only, further treatment not indicated     Status of treatment  One time only treatment, further skilled therapy not indicated;Patient appropriate for discharge from therapy     [place] Name 05/08/20 1900          Patient Safety Considerations    Patient safety considerations  Patient returned to bed at end of treatment;Call light left in reach and fall precautions in place;Patient left  in appropriate pressure relieving position;Patient may be at risk for falls;Nursing notified of safety considerations at end of treatment     Patient assistive device requirements for safe ambulation  [place] Name 05/08/20 1900          Post Acute Discharge Recommendations    Discharge Rehabilitation Reccomendations [facility] ONLY)  Would benefit from intermittent Occupational therapy post acute discharge to maximize functional independence;1-3 times per week;Home  safety     Equipment recommendations  [place] Name 05/08/20 1900          Therapy Plan Communication    Therapy Plan Communication  Discussed therapy plan with Nursing and/or Physician     [place] Name 05/08/20 1900          Occupational Therapy Patient Discharge Instructions    Your Occupational Therapist suggests the following  Continue to complete your home exercise program daily as instructed;Continue to complete your self care Activities of Daily Living as frequently as possible;Continue to use energy conservation, pursed lip breathing and  self-pacing when completing your self care Activities of Daily Living;Supervision is suggested when you     Supervision is suggested when you  bath;dress;toilet     Row Name 05/08/20 1900          Type of Eval    Low Complexity [phone])  Completed     Row Name 05/08/20 1900          Therapeutic Procedures    Self-Care/ADL Training [phone])  Activities of daily living training;Patient education         Total TIMED Treatment (min)  15           The occupational therapist of record is endorsed by evaluating occupational therapist.

## 2020-05-09 ENCOUNTER — Other Ambulatory Visit: Payer: Self-pay

## 2020-05-09 DIAGNOSIS — R42 Dizziness and giddiness: Secondary | ICD-10-CM | POA: Diagnosis present

## 2020-05-09 DIAGNOSIS — I480 Paroxysmal atrial fibrillation: Secondary | ICD-10-CM | POA: Diagnosis present

## 2020-05-09 DIAGNOSIS — D508 Other iron deficiency anemias: Secondary | ICD-10-CM | POA: Diagnosis present

## 2020-05-09 LAB — IRON/IBC PANEL
% Saturation: 5 % — ABNORMAL LOW (ref 20–55)
Ferritin: 70 NG/ML (ref 10–107)
Iron: 13 ug/dL — ABNORMAL LOW (ref 37–170)
TIBC: 246 ug/dL — ABNORMAL LOW (ref 284–507)
Transferrin: 176 MG/DL — ABNORMAL LOW (ref 203–362)

## 2020-05-09 LAB — BASIC METABOLIC PANEL, BLOOD
BUN: 11 mg/dL (ref 7–25)
CO2: 24 mmol/L (ref 21–31)
Calcium: 8.5 mg/dL — ABNORMAL LOW (ref 8.6–10.3)
Chloride: 104 mmol/L (ref 98–107)
Creat: 0.9 mg/dL (ref 0.6–1.2)
Electrolyte Balance: 8 mmol/L (ref 2–12)
Glucose: 106 mg/dL (ref 85–125)
Potassium: 4.6 mmol/L (ref 3.5–5.1)
Sodium: 136 mmol/L (ref 136–145)
eGFR - high estimate: >60 (ref >59)
eGFR - low estimate: >60 (ref >59)

## 2020-05-09 LAB — URINE CULTURE
Culture Result: >100000 — AB
Culture Result: >100000 — AB
Culture Result: >100000 — AB

## 2020-05-09 LAB — CBC WITH DIFF, BLOOD
ANC automated: 4.9 10*3/uL (ref 2.0–8.1)
Basophils %: 0.3 %
Basophils Absolute: 0 10*3/uL (ref 0.0–0.2)
Eosinophils %: 2.3 %
Eosinophils Absolute: 0.2 10*3/uL (ref 0.0–0.5)
Hematocrit: 24 % — ABNORMAL LOW (ref 34.0–44.0)
Hgb: 8.2 G/DL — ABNORMAL LOW (ref 11.5–15.0)
Lymphocytes %: 17.2 %
Lymphocytes Absolute: 1.2 10*3/uL (ref 0.9–3.3)
MCH: 29.9 PG (ref 27.0–33.5)
MCHC: 34.3 G/DL (ref 32.0–35.5)
MCV: 87.1 FL (ref 81.5–97.0)
MPV: 7.8 FL (ref 7.2–11.7)
Monocytes %: 8 %
Monocytes Absolute: 0.5 10*3/uL (ref 0.0–0.8)
Neutrophils % (A): 72.2 %
PLT Count: 249 10*3/uL (ref 150–400)
RBC: 2.75 10*6/uL — ABNORMAL LOW (ref 3.70–5.00)
RDW-CV: 15.2 % — ABNORMAL HIGH (ref 11.6–14.4)
White Bld Cell Count: 6.7 10*3/uL (ref 4.0–10.5)

## 2020-05-09 LAB — MRSA CULTURE
Culture Result: NEGATIVE
Culture Result: NEGATIVE

## 2020-05-09 LAB — PHOSPHORUS, BLOOD: Phosphorus: 3.3 MG/DL (ref 2.5–5.0)

## 2020-05-09 LAB — MAGNESIUM, BLOOD: Magnesium: 1.8 mg/dL — ABNORMAL LOW (ref 1.9–2.7)

## 2020-05-09 LAB — GLUCOSE, POINT OF CARE
Glucose, Point of Care: 110 MG/DL (ref 70–125)
Glucose, Point of Care: 195 MG/DL — ABNORMAL HIGH (ref 70–125)

## 2020-05-09 LAB — VITAMIN B12, BLOOD: Vitamin B12 Lvl: 475 PG/ML (ref 180–1241)

## 2020-05-09 LAB — FOLIC ACID, BLOOD: Folate, Srm: 16.9 NG/ML (ref >5.9)

## 2020-05-09 MED ORDER — CYANOCOBALAMIN 50 MCG OR TABS
50.0000 ug | ORAL_TABLET | Freq: Every day | ORAL | 3 refills | Status: AC
Start: 2020-05-09 — End: ?

## 2020-05-09 MED ORDER — CYANOCOBALAMIN 50 MCG OR TABS
50.0000 ug | ORAL_TABLET | Freq: Every day | ORAL | 3 refills | Status: DC
Start: 2020-05-09 — End: 2020-05-09

## 2020-05-09 MED ORDER — METOPROLOL TARTRATE 25 MG OR TABS
75.0000 mg | ORAL_TABLET | Freq: Two times a day (BID) | ORAL | 3 refills | Status: DC
Start: 2020-05-09 — End: 2020-05-09
  Filled 2020-05-09: qty 180, 30d supply, fill #0

## 2020-05-09 MED ORDER — FOLIC ACID 1 MG OR TABS
1.0000 mg | ORAL_TABLET | Freq: Every day | ORAL | 3 refills | Status: DC
Start: 2020-05-09 — End: 2020-05-09

## 2020-05-09 MED ORDER — MAGNESIUM OXIDE 400 MG OR TABS
800.0000 mg | ORAL_TABLET | Freq: Every day | ORAL | 3 refills | Status: DC
Start: 2020-05-09 — End: 2020-05-09

## 2020-05-09 MED ORDER — FOLIC ACID 1 MG OR TABS
1.0000 mg | ORAL_TABLET | Freq: Every day | ORAL | 3 refills | Status: AC
Start: 2020-05-09 — End: ?

## 2020-05-09 MED ORDER — METOPROLOL TARTRATE 50 MG OR TABS
75.0000 mg | ORAL_TABLET | Freq: Two times a day (BID) | ORAL | Status: DC
Start: 2020-05-09 — End: 2020-05-09

## 2020-05-09 MED ORDER — MAGNESIUM SULFATE 2 GM/50ML IV SOLN
2.0000 g | Freq: Once | INTRAVENOUS | Status: AC
Start: 2020-05-09 — End: 2020-05-09
  Administered 2020-05-09 (×2): 2 g via INTRAVENOUS
  Filled 2020-05-09: qty 50

## 2020-05-09 MED ORDER — FOLIC ACID 1 MG OR TABS
1.0000 mg | ORAL_TABLET | Freq: Every day | ORAL | 3 refills | Status: DC
Start: 2020-05-09 — End: 2020-05-09
  Filled 2020-05-09: qty 90, 90d supply, fill #0

## 2020-05-09 MED ORDER — METOPROLOL TARTRATE 25 MG OR TABS
25.0000 mg | ORAL_TABLET | Freq: Once | ORAL | Status: AC
Start: 2020-05-09 — End: 2020-05-09
  Administered 2020-05-09: 25 mg via ORAL
  Filled 2020-05-09: qty 1

## 2020-05-09 MED ORDER — METOPROLOL TARTRATE 50 MG OR TABS
50.0000 mg | ORAL_TABLET | Freq: Two times a day (BID) | ORAL | Status: DC
Start: 2020-05-09 — End: 2020-05-09
  Administered 2020-05-09: 50 mg via ORAL
  Filled 2020-05-09: qty 1

## 2020-05-09 MED ORDER — MAGNESIUM OXIDE 400 (241.3 ELEMENTAL MG) MG OR TABS
800.0000 mg | ORAL_TABLET | Freq: Every day | ORAL | 3 refills | Status: DC
Start: 2020-05-09 — End: 2020-05-09
  Filled 2020-05-09: qty 90, 45d supply, fill #0

## 2020-05-09 MED ORDER — CYANOCOBALAMIN 50 MCG OR TABS
50.0000 ug | ORAL_TABLET | Freq: Every day | ORAL | 3 refills | Status: DC
Start: 2020-05-09 — End: 2020-05-09
  Filled 2020-05-09: qty 30, 30d supply, fill #0

## 2020-05-09 MED ORDER — SODIUM CHLORIDE 0.9 % IV SOLN
100.0000 mg | Freq: Once | INTRAVENOUS | Status: AC
Start: 2020-05-09 — End: 2020-05-09
  Administered 2020-05-09 (×2): 100 mg via INTRAVENOUS
  Filled 2020-05-09: qty 5

## 2020-05-09 MED ORDER — MAGNESIUM OXIDE 400 (241.3 ELEMENTAL MG) MG OR TABS
800.0000 mg | ORAL_TABLET | Freq: Every day | ORAL | 3 refills | Status: AC
Start: 2020-05-09 — End: ?

## 2020-05-09 MED ORDER — METOPROLOL TARTRATE 25 MG OR TABS
75.0000 mg | ORAL_TABLET | Freq: Two times a day (BID) | ORAL | 3 refills | Status: AC
Start: 2020-05-09 — End: ?

## 2020-05-09 MED ORDER — IRON SUCROSE 20 MG/ML IV SOLN
100.0000 mg | Freq: Once | INTRAVENOUS | Status: DC
Start: 2020-05-09 — End: 2020-05-09

## 2020-05-09 NOTE — Interdisciplinary (Signed)
Received a call from [place], [facility], that patient had a walker sent to her last year and is not eligible for another one. Also, [facility] to ff up.  Text page Tera Mater, case manager re above. Per patient she have a walker at home.

## 2020-05-09 NOTE — Plan of Care (Signed)
Problem: Promotion of health and safety  Goal: Promotion of Health and Safety  Description: The patient remains safe, receives appropriate treatment and achieves optimal outcomes (physically, psychosocially, and spiritually) within the limitations of the disease process by discharge.  Outcome: Progressing  Flowsheets (Taken 05/09/2020 0553)  Standard of Care/Policy:   Telemetry   Skin   Falls Reduction  Outcome Evaluation (rationale for progressing/not progressing) every shift: Patient rested throughout the night with no difficulties.  Patient denies pain.  Blood sugar checked, and was 280 and Lantus 15 given units.  Individualized Interventions/Recommendations (Discharge Readiness): Teach the patient to keep all outpt appointments and to take medications as precribes.  Individualized Interventions/Recommendations (Skin/Comfort/Safety/Mobility): teach the patient to turn Q2hrs to prevnet skin breakdown.  Continue to encourage the pateint to use he call light when assistance isneded   Nursing Shift Summary    No data found.  No data found.             No data found.  Shift Comments for the past 12 hrs:   Comments   05/08/20 2000 Patient assessed and appears to be in no apparent distress.  Patient is alert and oriented and is alert and oriented and is able to verbalize understanding.  Patient denies pain at this time.  Patient is noted to have a chronic foley cath, she is tolerating this well.  Isolation maintained.  VSS, call light within reach adn hourly rounds made   05/09/20 0400 Patient resting and in no distress.  Patient denies pain at this time.  VSS, call light withn reach and hourly rounds made.

## 2020-05-09 NOTE — Interdisciplinary (Addendum)
Care Management Assessment, Adult       Initial Assessment  CM Initial Assessment *: Completed    Patient Information  Where was the patient admitted from? *: Home  Prior to Level of Function *: Ambulatory/Independent with ADL's  Primary Caretaker(s) *: Self  Primary Contact Name, Number and Relationship *: daughter,Olga [phone]           Discharge Planning  Living Arrangements *: Family Member  Available Assistance/Support System *: Family member(s)  Type of Residence *: [place] *: No  Anticipated Discharge Dispostion/Needs: DME/Oxygen;HH RN;HH PT  Barriers to Discharge *: Clinical reason  Patient/Family/Other Engaged in Discharge Planning *: Yes  Patient/Family/Other Are In Agreement With Discharge Plan *: Yes                   Discussed with team, for  Discharge home today with [facility] and FWW.Informed discharge facilitator, Armanda Heritage has order for [facility] and FWW and for discharge home today.    Call to daughter, Michelene Heady, spoke with daughter and grandson, discussed CM role and  Anticipated discharge plan, home with [place] and FWW.Lives wioth family, verified address on face sheet. Plan will be home  Upon discharge.      3:00pm- still awaiting authorization for [place] and FWW. Team requested FWW be delivered to home,informed discharge facilitator, Armanda Heritage, of  Request to deliver [place] to patient's home.    Received message from nurse  [facility] called unit, patient had a walker sent to her last year and is not  Eligible for FWW.[facility]  Set up.

## 2020-05-09 NOTE — Interdisciplinary (Addendum)
05/10/2020  1040:  CMA called Jarrett Soho at [facility], who confirmed pt has an accepting [facility] with [facility], and will initiate SOC. CMA also confirmed pt had received a fww last year, and does not qualify for a new one.    1415:  CMA faxed packet to [facility] to initiate auth process (F# [phone]), CMA called P# [phone] and spoke with representative Thurmond Butts who stated they are currently processing auth and will give CMA a call back when everything has been processed.    CMA sent referral for FWW to [facility]    CMA sent [facility] referral for PT and Safety Eval to:  [facility]    9407:  CMA called [facility] again to inquire about Josem Kaufmann, however they stated they are still processing he order. CMA informed them that fww will need to be delivered to home address on fs. CMA will have to f/u.    1625:  Accepting HH:    [facility] [phone]   Response: Yes, willing to accept patient  Comments: pls kindly provide me with insurance info . thank   CMA will f/u with Jarrett Soho from [facility] tomorrow to provide auth  CMA received a call from [place] @ [facility] confirming FWW will be delivered to Home address on file.     05/09/20 1625   Discharge Planning Needs   Does this patient have CM discharge planning needs? Yes   CM Needs Met? Yes   Does this patient have SW discharge planning needs? No   Final Discharge Destination/Services   Final Discharge Destination/Services * Home;Home Health;DME   Home Health/Home [place]   Phone [phone]   DME   [place] (DME) * [facility] * [facility]

## 2020-05-10 ENCOUNTER — Other Ambulatory Visit: Payer: Self-pay

## 2020-05-10 LAB — ECG 12-LEAD
P AXIS: 999 Deg
PR INTERVAL: 0 ms
QRS INTERVAL/DURATION: 85 ms
QT: 303 ms
QTC INTERVAL: 375 ms
R AXIS: 26 Deg
R-R INTERVAL AVERAGE: 490 ms
T AXIS: 48 Deg
VENTRICULAR RATE: 122 {beats}/min

## 2020-05-11 ENCOUNTER — Other Ambulatory Visit: Payer: Self-pay

## 2020-05-12 LAB — BLOOD CULTURE
Culture Result: NO GROWTH
Culture Result: NO GROWTH

## 2020-05-17 ENCOUNTER — Other Ambulatory Visit: Payer: Self-pay

## 2020-05-26 LAB — ECG 12-LEAD
P AXIS: 999 Deg
PR INTERVAL: 0 ms
QRS INTERVAL/DURATION: 82 ms
QT: 267 ms
QTc (Bazett): 350 ms
R AXIS: 182 Deg
R-R INTERVAL AVERAGE: 414 ms
T AXIS: 56 Deg
VENTRICULAR RATE: 144 {beats}/min

## 2020-05-28 ENCOUNTER — Other Ambulatory Visit: Payer: Self-pay

## 2020-06-04 ENCOUNTER — Other Ambulatory Visit: Payer: Self-pay

## 2020-08-20 ENCOUNTER — Other Ambulatory Visit: Payer: Self-pay

## 2021-01-12 DEATH — deceased

## 2023-01-20 IMAGING — OT DXA BONE DENSITY
2 series · 2 of 2 positions shown · non-contrast
Comparison: none

REASON FOR EXAM: Postmenopausal screening

RISK FACTORS:  Family history of hip fracture
PRIOR EXAMS:  DXA from [HOSPITAL] dated 11/05/2017.
METHOD:  Scans of the spine and hip were performed using dual energy X-ray densitometry (DXA) with the [employer] C ([id number])  system.

[Series 1: — · left · 1 of 1 slices shown (1 of 2)]
[im 1/1]
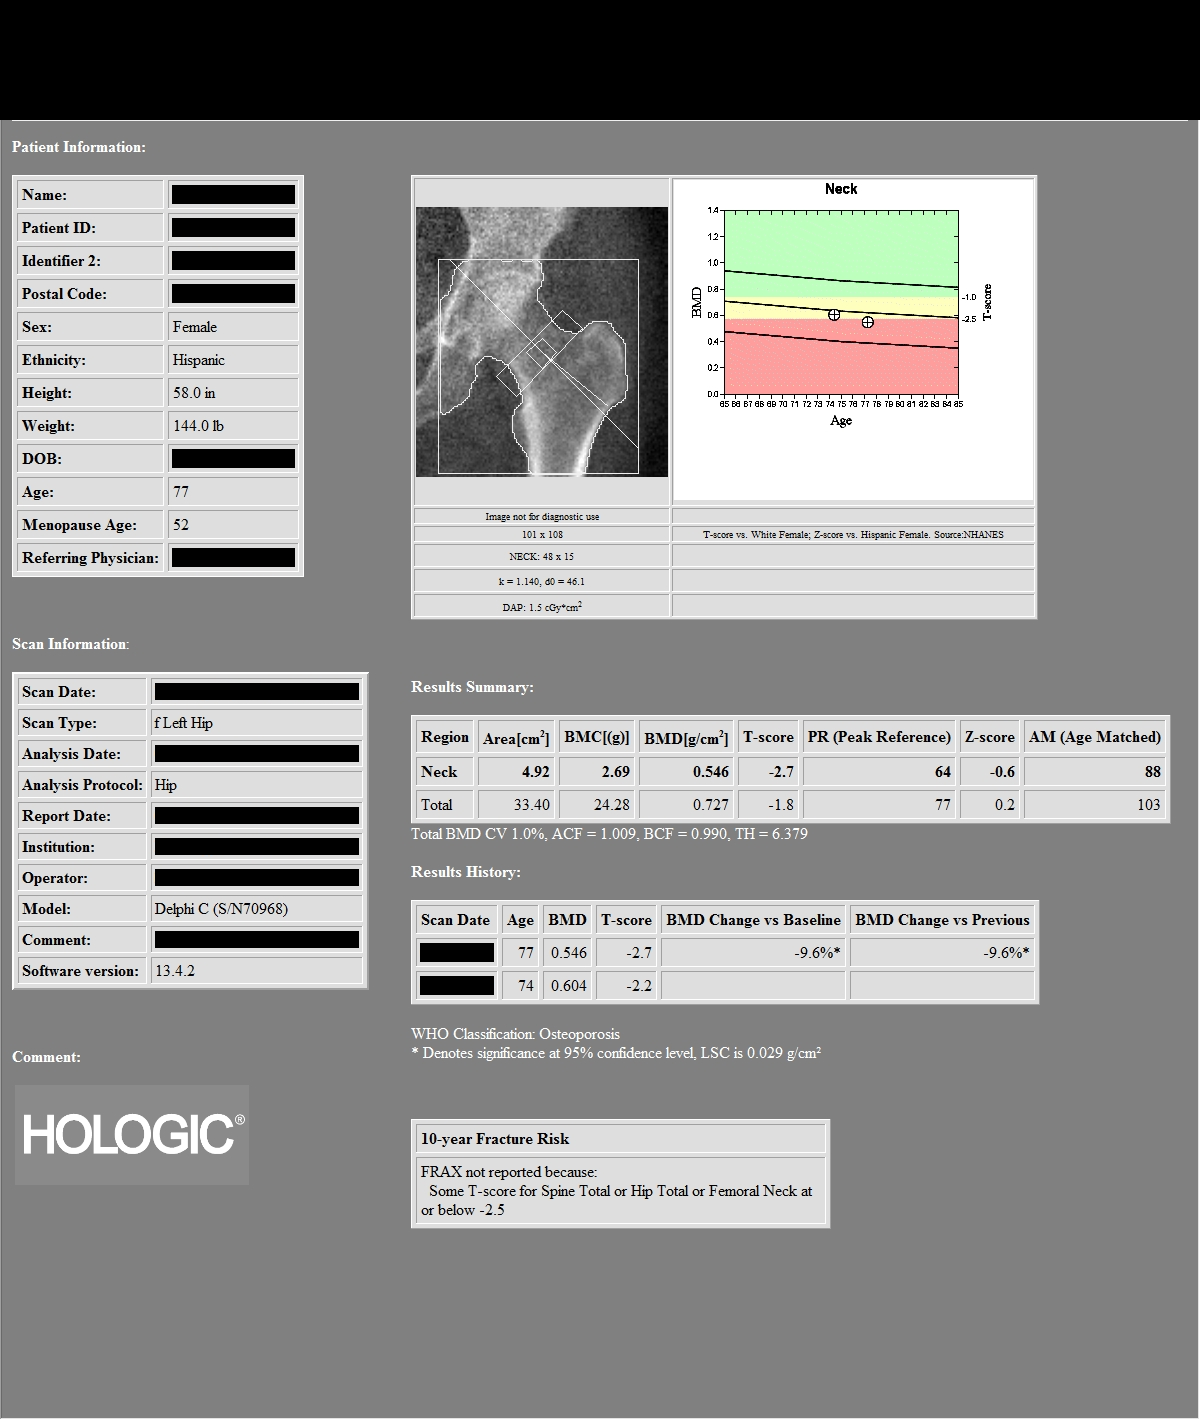

[Series 2: — · 1 of 1 slices shown (2 of 2)]
[im 1/1]
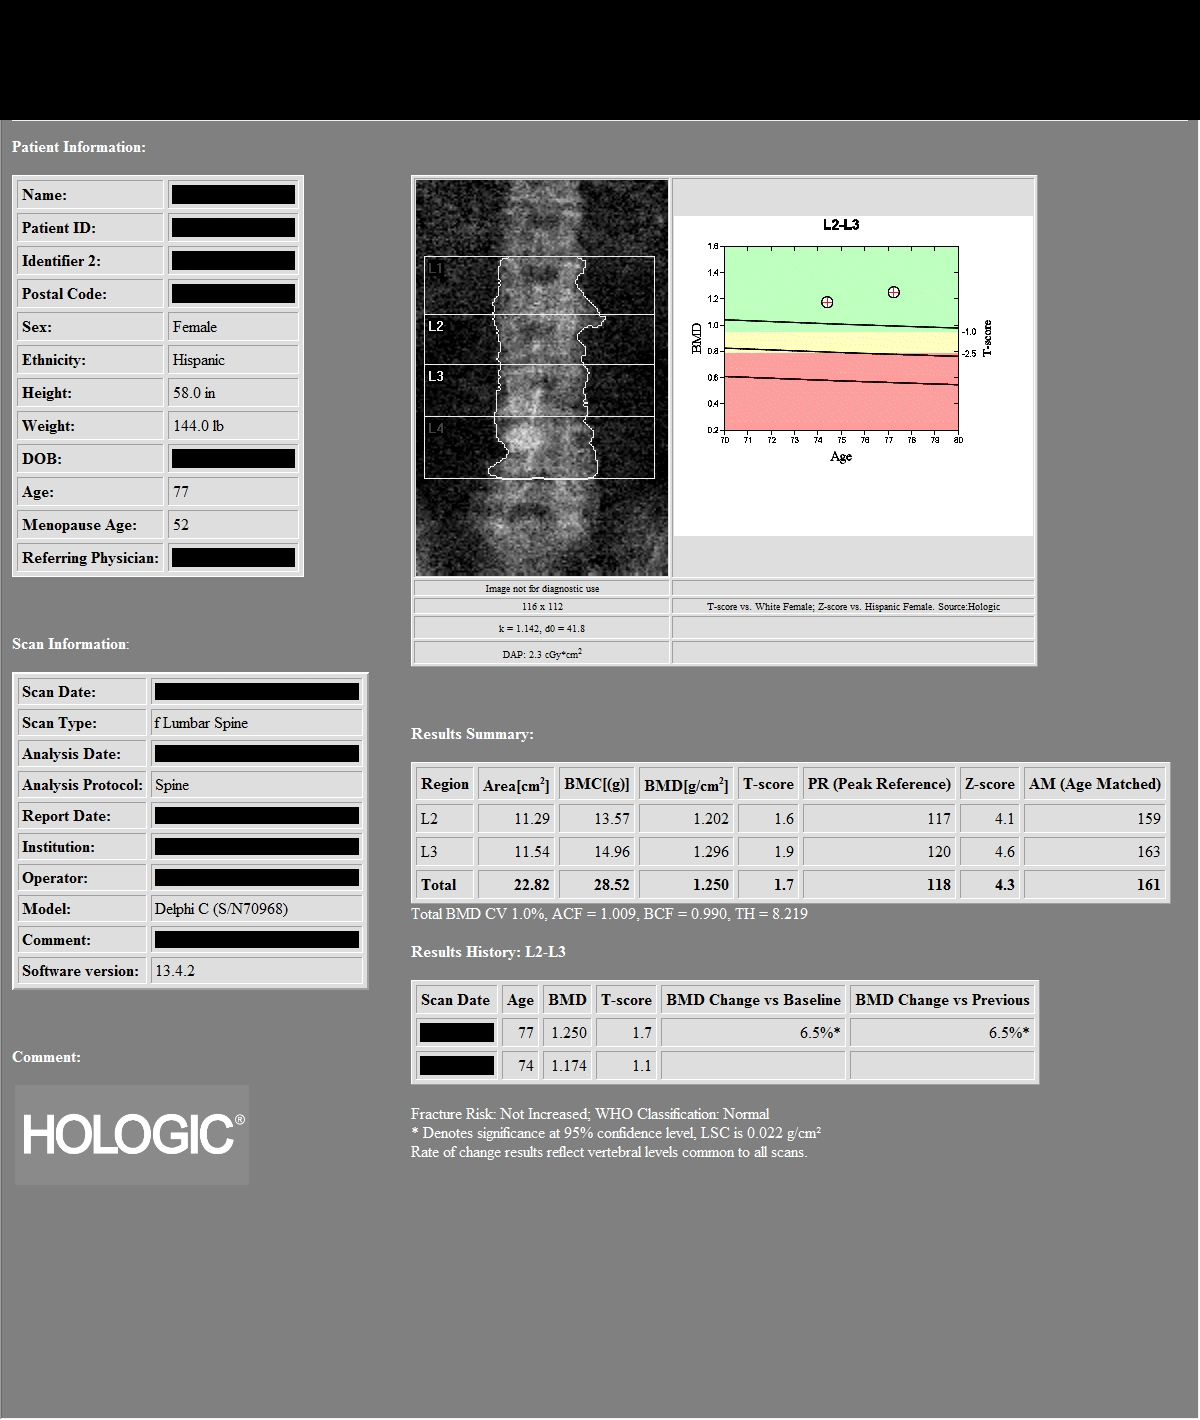

[2 of 2 positions shown; findings below may reference images not displayed]

IMPRESSION: As defined by World Health Organization, the patient meets the criteria for OSTEOPOROSIS based on hip T-scores.

PATIENT DEMOGRAPHICS:  79-year-old Hispanic female.
FINDINGS: 1.    Review of scanogram images shows no factor invalidating scan results. L1 and L4 were excluded because of T-scores greater than 2.0.

2.    The lumbar spine exam using L2-L3 regions shows average Bone Mineral Density is 1.250 gm/cm2 of Hydroxyapatite.  The T-score (comparing patient with a young adult group) is 1.7 standard deviations ABOVE mean. The Z-score (comparing patient with an age-matched group) is 4.3 standard deviations ABOVE mean.

COMPARED TO PRIOR DXA, spine bone density was 1.174 gm/cm2.  This is an interval increase of 0.076 gm/cm2 or 6.5 %. Technologist least significant change in the spine is 0.030 gm/cm2. This is a statistically significant interval increase.

3.  The left hip exam using femoral neck region of interest shows average Bone Mineral Density is 0.546 gm/cm2 of Hydroxyapatite. The T-score (comparing patient with a young adult group) is 2.7 standard deviations BELOW mean. The Z-score (comparing patient with an age-matched group) is 0.6 standard deviations BELOW mean.

COMPARED TO PRIOR DXA, hip bone density was 0.604 gm/cm2.  This is an interval decrease of 0.058 gm/cm2 or -9.6 %. Technologist least significant change in the hip is 0.032 gm/cm2. This is a statistically significant interval decrease.

RECOMMENDATIONS:  The patient states that she is taking supplements on a regular basis.  The patient should continue being a nonsmoker and  REGULAR EXERCISE TO PATIENT TOLERANCE WOULD BE OF BENEFIT.  The patient is currently not taking prescribed medication for prevention of bone loss.  According to criteria established by the National Osteoporosis Foundation, the patient DOES meet the current indications for prescribed medical therapy.  The National Osteoporosis Foundation now recommends followup DXA scanning every two years in patients at risk regardless of whether the patient is undergoing pharmacological treatment.
